# Patient Record
Sex: Female | Born: 1990 | Race: Black or African American | Hispanic: No | Marital: Single | Smoking: Never smoker
Health system: Southern US, Community
[De-identification: ages and names within clinical notes are randomized; demographics above are authoritative.]

## PROBLEM LIST (undated history)

## (undated) ENCOUNTER — Inpatient Hospital Stay (HOSPITAL_COMMUNITY): Payer: Self-pay

## (undated) DIAGNOSIS — R51 Headache: Secondary | ICD-10-CM

## (undated) DIAGNOSIS — R519 Headache, unspecified: Secondary | ICD-10-CM

## (undated) HISTORY — PX: NO PAST SURGERIES: SHX2092

---
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
## 2010-05-17 ENCOUNTER — Emergency Department (HOSPITAL_COMMUNITY): Admission: EM | Admit: 2010-05-17 | Discharge: 2010-05-17 | Payer: Self-pay | Admitting: Family Medicine

## 2010-10-26 ENCOUNTER — Emergency Department (HOSPITAL_COMMUNITY)
Admission: EM | Admit: 2010-10-26 | Discharge: 2010-10-26 | Payer: Self-pay | Source: Home / Self Care | Admitting: Emergency Medicine

## 2010-12-02 ENCOUNTER — Emergency Department (HOSPITAL_COMMUNITY)
Admission: EM | Admit: 2010-12-02 | Discharge: 2010-12-02 | Payer: Self-pay | Source: Home / Self Care | Admitting: Emergency Medicine

## 2010-12-02 LAB — WET PREP, GENITAL
Clue Cells Wet Prep HPF POC: NONE SEEN
Trich, Wet Prep: NONE SEEN

## 2010-12-02 LAB — HCG, SERUM, QUALITATIVE: Preg, Serum: NEGATIVE

## 2010-12-03 LAB — POCT URINALYSIS DIPSTICK
Hgb urine dipstick: NEGATIVE
Ketones, ur: NEGATIVE mg/dL
Nitrite: NEGATIVE
Specific Gravity, Urine: 1.02 (ref 1.005–1.030)
Specific Gravity, Urine: 1.025 (ref 1.005–1.030)
Urine Glucose, Fasting: NEGATIVE mg/dL
Urobilinogen, UA: 0.2 mg/dL (ref 0.0–1.0)

## 2010-12-04 LAB — GC/CHLAMYDIA PROBE AMP, GENITAL
Chlamydia, DNA Probe: NEGATIVE
GC Probe Amp, Genital: NEGATIVE

## 2010-12-04 LAB — URINE CULTURE
Colony Count: >=100000
Culture  Setup Time: 201201300119

## 2010-12-30 ENCOUNTER — Inpatient Hospital Stay (HOSPITAL_COMMUNITY)
Admission: AD | Admit: 2010-12-30 | Discharge: 2010-12-30 | Disposition: A | Discharge status: Home / Self Care | Payer: Medicaid Other | Source: Ambulatory Visit | Attending: Obstetrics and Gynecology | Admitting: Obstetrics and Gynecology

## 2010-12-30 ENCOUNTER — Inpatient Hospital Stay (HOSPITAL_COMMUNITY): Payer: Medicaid Other

## 2010-12-30 DIAGNOSIS — N946 Dysmenorrhea, unspecified: Secondary | ICD-10-CM | POA: Insufficient documentation

## 2010-12-30 LAB — CBC
HCT: 32.7 % — ABNORMAL LOW (ref 36.0–46.0)
MCHC: 31.5 g/dL (ref 30.0–36.0)
Platelets: 318 10*3/uL (ref 150–400)

## 2010-12-30 LAB — WET PREP, GENITAL
Clue Cells Wet Prep HPF POC: NONE SEEN
Trich, Wet Prep: NONE SEEN
Yeast Wet Prep HPF POC: NONE SEEN

## 2010-12-30 LAB — SAMPLE TO BLOOD BANK

## 2010-12-30 LAB — POCT PREGNANCY, URINE: Preg Test, Ur: NEGATIVE

## 2010-12-31 LAB — GC/CHLAMYDIA PROBE AMP, GENITAL: Chlamydia, DNA Probe: NEGATIVE

## 2011-01-14 LAB — POCT URINALYSIS DIPSTICK
Bilirubin Urine: NEGATIVE
Nitrite: NEGATIVE
Specific Gravity, Urine: 1.015 (ref 1.005–1.030)
pH: 8.5 — ABNORMAL HIGH (ref 5.0–8.0)

## 2011-02-08 LAB — POCT URINALYSIS DIP (DEVICE)
Bilirubin Urine: NEGATIVE
Glucose, UA: NEGATIVE mg/dL
Ketones, ur: NEGATIVE mg/dL
Nitrite: NEGATIVE
Protein, ur: NEGATIVE mg/dL
pH: 7.5 (ref 5.0–8.0)

## 2011-02-18 LAB — POCT URINALYSIS DIP (DEVICE)
Nitrite: POSITIVE — AB
Urobilinogen, UA: 0.2 mg/dL (ref 0.0–1.0)
pH: 6.5 (ref 5.0–8.0)

## 2011-08-19 ENCOUNTER — Emergency Department (HOSPITAL_COMMUNITY): Payer: Medicaid Other

## 2011-08-19 ENCOUNTER — Emergency Department (HOSPITAL_COMMUNITY)
Admission: EM | Admit: 2011-08-19 | Discharge: 2011-08-19 | Disposition: A | Discharge status: Home / Self Care | Payer: Medicaid Other | Attending: Emergency Medicine | Admitting: Emergency Medicine

## 2011-08-19 DIAGNOSIS — O200 Threatened abortion: Secondary | ICD-10-CM | POA: Insufficient documentation

## 2011-08-19 DIAGNOSIS — R1031 Right lower quadrant pain: Secondary | ICD-10-CM | POA: Insufficient documentation

## 2011-08-19 LAB — POCT I-STAT, CHEM 8
HCT: 35 % — ABNORMAL LOW (ref 36.0–46.0)
Hemoglobin: 11.9 g/dL — ABNORMAL LOW (ref 12.0–15.0)
Potassium: 3.6 mEq/L (ref 3.5–5.1)
Sodium: 136 mEq/L (ref 135–145)
TCO2: 23 mmol/L (ref 0–100)

## 2011-08-19 LAB — URINALYSIS, ROUTINE W REFLEX MICROSCOPIC
Bilirubin Urine: NEGATIVE
Glucose, UA: NEGATIVE mg/dL
Hgb urine dipstick: NEGATIVE
Specific Gravity, Urine: 1.016 (ref 1.005–1.030)
pH: 6.5 (ref 5.0–8.0)

## 2011-08-19 LAB — WET PREP, GENITAL
Clue Cells Wet Prep HPF POC: NONE SEEN
Trich, Wet Prep: NONE SEEN
Yeast Wet Prep HPF POC: NONE SEEN

## 2011-08-19 LAB — CBC
HCT: 30.9 % — ABNORMAL LOW (ref 36.0–46.0)
Hemoglobin: 10.1 g/dL — ABNORMAL LOW (ref 12.0–15.0)
MCHC: 32.7 g/dL (ref 30.0–36.0)
RDW: 14.8 % (ref 11.5–15.5)
WBC: 10 10*3/uL (ref 4.0–10.5)

## 2011-08-19 LAB — DIFFERENTIAL
Basophils Absolute: 0 10*3/uL (ref 0.0–0.1)
Basophils Relative: 0 % (ref 0–1)
Lymphocytes Relative: 17 % (ref 12–46)
Neutro Abs: 7.4 10*3/uL (ref 1.7–7.7)

## 2011-08-19 LAB — POCT PREGNANCY, URINE: Preg Test, Ur: POSITIVE

## 2011-08-19 LAB — ABO/RH: ABO/RH(D): O POS

## 2011-08-19 LAB — HCG, QUANTITATIVE, PREGNANCY: hCG, Beta Chain, Quant, S: 158739 m[IU]/mL — ABNORMAL HIGH (ref <5)

## 2011-08-20 LAB — GC/CHLAMYDIA PROBE AMP, GENITAL: GC Probe Amp, Genital: NEGATIVE

## 2011-08-21 LAB — OB RESULTS CONSOLE HIV ANTIBODY (ROUTINE TESTING): HIV: NONREACTIVE

## 2011-09-14 ENCOUNTER — Inpatient Hospital Stay (HOSPITAL_COMMUNITY)
Admission: AD | Admit: 2011-09-14 | Discharge: 2011-09-14 | Disposition: A | Discharge status: Home / Self Care | Payer: Medicaid Other | Source: Ambulatory Visit | Attending: Obstetrics and Gynecology | Admitting: Obstetrics and Gynecology

## 2011-09-14 ENCOUNTER — Encounter (HOSPITAL_COMMUNITY): Payer: Self-pay

## 2011-09-14 DIAGNOSIS — O285 Abnormal chromosomal and genetic finding on antenatal screening of mother: Secondary | ICD-10-CM | POA: Diagnosis present

## 2011-09-14 DIAGNOSIS — D649 Anemia, unspecified: Secondary | ICD-10-CM

## 2011-09-14 DIAGNOSIS — O209 Hemorrhage in early pregnancy, unspecified: Secondary | ICD-10-CM | POA: Diagnosis present

## 2011-09-14 NOTE — ED Provider Notes (Signed)
History     Chief Complaint  Patient presents with  . Vaginal Bleeding   HPI Comments: Pt is a G1P0 at 14wks with CC of vaginal bleeding that started last night and has continued off and on today. Pt states she's had to use a pad, denies passing any clots, states she's changed her pad a couple times. Denies any pain, states she's maybe felt mild cramping once or twice. Also had bleeding earlier on in pregnancy.  Pt was seen in the office on Friday. Per pt her US showed that "I might have a miscarriage, the baby might be premature or the baby might be autistic"  Pt denies any abnormal discharge, no severe N/V.       Past Medical History  Diagnosis Date  . Asthma     No past surgical history on file.  No family history on file.  History  Substance Use Topics  . Smoking status: Former Games developer  . Smokeless tobacco: Not on file  . Alcohol Use: No    Allergies: No Known Allergies  Prescriptions prior to admission  Medication Sig Dispense Refill  . acetaminophen (TYLENOL) 325 MG tablet Take 650 mg by mouth every 6 (six) hours as needed. For headaches       . prenatal vitamin w/FE, FA (PRENATAL 1 + 1) 27-1 MG TABS Take 1 tablet by mouth daily.          Review of Systems  Genitourinary:       Vaginal bleeding   All other systems reviewed and are negative.   Physical Exam   Blood pressure 106/64, pulse 117, temperature 99 F (37.2 C), temperature source Oral, resp. rate 18, height 5\' 4"  (1.626 m), weight 55.339 kg (122 lb).  Physical Exam  Constitutional: She is oriented to person, place, and time. She appears well-developed and well-nourished.  Cardiovascular: Normal rate.   Respiratory: Effort normal.  GI: Soft. There is no tenderness.  Genitourinary:       Deferred, peri-pad with 2 sm spots of brown/red bleeding  Musculoskeletal: Normal range of motion.  Neurological: She is alert and oriented to person, place, and time.  Skin: Skin is warm and dry.  Psychiatric:  She has a normal mood and affect. Her behavior is normal.   +FHT's    MAU Course  Procedures    Assessment and Plan  IUP at 14wks  1st trimester bleeding Abnormal 1st trimester screen 1:90 DS risk  D/W Dr. Pennie Rushing May dc pt home, she is to f/u with MFM for anatomy US Bleeding precautions given Clarified with pt  increased DS risk    Kato Wieczorek M 09/14/2011, 10:40 AM

## 2011-09-14 NOTE — Progress Notes (Signed)
Onset of vaginal bleeding since yesterday after seeing OB/GYN was not bleeding then started off as brown then had a lot of blood in toilet no clots, still bleeding, mild cramping has had early episode of bleeding early in pregnancy.

## 2011-09-16 ENCOUNTER — Other Ambulatory Visit (HOSPITAL_COMMUNITY): Payer: Self-pay | Admitting: Obstetrics and Gynecology

## 2011-09-16 DIAGNOSIS — O289 Unspecified abnormal findings on antenatal screening of mother: Secondary | ICD-10-CM

## 2011-10-09 ENCOUNTER — Ambulatory Visit (HOSPITAL_COMMUNITY): Payer: Medicaid Other | Attending: Obstetrics and Gynecology

## 2011-10-15 ENCOUNTER — Ambulatory Visit (HOSPITAL_COMMUNITY)
Admission: RE | Admit: 2011-10-15 | Discharge: 2011-10-15 | Disposition: A | Discharge status: Home / Self Care | Payer: Medicaid Other | Source: Ambulatory Visit | Attending: Obstetrics and Gynecology | Admitting: Obstetrics and Gynecology

## 2011-10-15 ENCOUNTER — Encounter (HOSPITAL_COMMUNITY): Payer: Self-pay

## 2011-10-15 DIAGNOSIS — A6000 Herpesviral infection of urogenital system, unspecified: Secondary | ICD-10-CM | POA: Insufficient documentation

## 2011-10-15 DIAGNOSIS — Z1389 Encounter for screening for other disorder: Secondary | ICD-10-CM | POA: Insufficient documentation

## 2011-10-15 DIAGNOSIS — O289 Unspecified abnormal findings on antenatal screening of mother: Secondary | ICD-10-CM

## 2011-10-15 DIAGNOSIS — Z363 Encounter for antenatal screening for malformations: Secondary | ICD-10-CM | POA: Insufficient documentation

## 2011-10-15 DIAGNOSIS — O98519 Other viral diseases complicating pregnancy, unspecified trimester: Secondary | ICD-10-CM | POA: Insufficient documentation

## 2011-10-15 DIAGNOSIS — O358XX0 Maternal care for other (suspected) fetal abnormality and damage, not applicable or unspecified: Secondary | ICD-10-CM | POA: Insufficient documentation

## 2011-10-15 NOTE — Progress Notes (Signed)
Genetic Counseling  High-Risk Gestation Note  Appointment Date:  10/15/2011 Referred By: Hal Morales, MD Date of Birth:  [birth year] Partner:  Lauren Booker    Pregnancy History: G1P0000 Estimated Date of Delivery: 03/14/12 Estimated Gestational Age: [redacted]w[redacted]d Attending: Particia Nearing, MD  Ms. Lauren Booker and her partner, Mr. Lauren Booker, were seen for genetic counseling because of an increased risk for fetal Down syndrome based on First trimester screen performed through [facility].  They were counseled regarding the First trimester screen result and the associated 1 in 90 risk for fetal Down syndrome.  We reviewed chromosomes, nondisjunction, and the common features and variable prognosis of Down syndrome.  In addition, we reviewed the screen adjusted reduction in risks for trisomies 18/13 (less than 1 in 10,000).  We also discussed other explanations for a screen positive result including: differences in maternal metabolism, and normal variation.  We reviewed other available screening and diagnostic options including detailed ultrasound and amniocentesis. They were counseled that 50-80% of fetuses with Down syndrome, when well visualized, have detectable anomalies or soft markers by ultrasound.  We discussed the risks, limitations, and benefits of each.  We discussed another type of screening test, noninvasive prenatal testing (NIPT), which utilizes cell free fetal DNA which is found in the maternal circulation. This test is not diagnostic for chromosome conditions, but can provide information regarding the presence or absence of extra fetal DNA for chromosomes 13, 18 and 21. The reported detection rate for Trisomy 21, and Trisomy 18 is greater than 99%, and is approximately 91% for Trisomy 13. The false positive rate is thought to be less than 1% for any of these conditions.   After thoughtful consideration of these options, Ms. Lauren Booker elected to have ultrasound, but declined  amniocentesis. Ultrasound performed today visualized an echogenic intracardiac focus (EIF). The ultrasound report will be sent under a separate cover.  An isolated echogenic focus is generally believed to be a normal variation without any concerns for the pregnancy.  Isolated echogenic cardiac foci are not associated with congenital heart defects in the baby or compromised cardiac function after birth.  However, an echogenic cardiac focus is associated with a slightly increased chance for Down syndrome in the pregnancy. Thus, the presence of an EIF would increase the risk for Down syndrome above the patient's First trimester screen result of 1 in 90 to 1 in  45. After consideration of all the options, and a clear understanding of the newness and limitations of the cell free fetal DNA testing, they elected to proceed with cell free fetal DNA testing and declined amniocentesis. Those results will be available in 8-10 days and will be forwarded to the patient's OB office when we receive them. They understand that ultrasound cannot rule out all birth defects or genetic syndromes.  The patient stated that she is not interested in amniocentesis at this time or in the future given the associated risk of complications.   Ms. Lauren Booker was provided with written information regarding sickle cell anemia (SCA) including the carrier frequency and incidence in the African-American population, the availability of carrier testing and prenatal diagnosis if indicated.  In addition, we discussed that hemoglobinopathies are routinely screened for as part of the [place] newborn screening panel.  She declined hemoglobin electrophoresis today and additional discussion of sickle cell testing.   Both family histories were reviewed and found to be noncontributory for birth defects, mental retardation, recurrent pregnancy loss, or known genetic conditions. Without further information regarding the provided  family history, an accurate genetic risk  cannot be calculated. Further genetic counseling is warranted if more information is obtained.  Ms. Lauren Booker  denied exposure to environmental toxins or chemical agents. She denied the use of alcohol, tobacco or street drugs. She denied significant viral illnesses during the course of her pregnancy. Her medical and surgical histories were contributory for migraines.   I counseled this couple for approximately 30 minutes regarding the above risks and available options.     Quinn Plowman, MS,  Certified Genetic Counselor 10/15/2011

## 2011-10-16 ENCOUNTER — Other Ambulatory Visit: Payer: Self-pay

## 2011-10-25 ENCOUNTER — Telehealth (HOSPITAL_COMMUNITY): Payer: Self-pay | Admitting: MS"

## 2011-10-25 NOTE — Telephone Encounter (Signed)
Called Lauren Booker to discuss results of [facility] (cell free fetal DNA testing), a type of noninvasive prenatal testing (NIPT).  These results are within normal range indicating less than 1 in 10,000 risk for each trisomy 21, trisomy 71, and trisomy 13.   We reviewed the limitations of this testing. This test  utilizes cell free fetal DNA found in the maternal circulation. This test is not diagnostic for chromosome conditions, but can provide information regarding the presence or absence of extra fetal DNA for chromosomes 13, 18 and 21. The reported detection rate is greater than 99% for Trisomy 21, greater than 97% for Trisomy 18, and is approximately 80% (8 out of 10) for Trisomy 13. The false positive rate is thought to be less than 1% for any of these conditions.

## 2011-11-04 ENCOUNTER — Other Ambulatory Visit: Payer: Self-pay

## 2011-11-05 NOTE — L&D Delivery Note (Signed)
Delivery Note At 2:45 PM a viable female, "Lauren Booker", was delivered via Vaginal, Spontaneous Delivery (Presentation: Left Occiput Anterior).  APGAR: 9, 9; weight 6 lb 14.6 oz (3135 g).   Placenta status: Intact, Spontaneous.  Cord: 3 vessels with the following complications: Short.  Cord pH: NA  Anesthesia: Epidural  Episiotomy: None Lacerations: 1st degree;Periurethral;Vaginal Suture Repair: 3.0 Est. Blood Loss (mL): 250  Mom to postpartum.  Baby to skin to skin.  Nigel Bridgeman 03/07/2012, 4:37 PM

## 2012-01-10 ENCOUNTER — Other Ambulatory Visit: Payer: Self-pay

## 2012-01-10 ENCOUNTER — Other Ambulatory Visit: Payer: Medicaid Other

## 2012-01-10 ENCOUNTER — Encounter (INDEPENDENT_AMBULATORY_CARE_PROVIDER_SITE_OTHER): Payer: Medicaid Other | Admitting: Obstetrics and Gynecology

## 2012-01-10 DIAGNOSIS — Z3400 Encounter for supervision of normal first pregnancy, unspecified trimester: Secondary | ICD-10-CM

## 2012-01-10 DIAGNOSIS — O358XX0 Maternal care for other (suspected) fetal abnormality and damage, not applicable or unspecified: Secondary | ICD-10-CM

## 2012-01-24 ENCOUNTER — Encounter (INDEPENDENT_AMBULATORY_CARE_PROVIDER_SITE_OTHER): Payer: Medicaid Other | Admitting: Obstetrics and Gynecology

## 2012-01-24 DIAGNOSIS — Z331 Pregnant state, incidental: Secondary | ICD-10-CM

## 2012-02-10 ENCOUNTER — Other Ambulatory Visit: Payer: Self-pay | Admitting: Obstetrics and Gynecology

## 2012-02-10 ENCOUNTER — Encounter (INDEPENDENT_AMBULATORY_CARE_PROVIDER_SITE_OTHER): Payer: Medicaid Other | Admitting: Obstetrics and Gynecology

## 2012-02-10 DIAGNOSIS — Z331 Pregnant state, incidental: Secondary | ICD-10-CM

## 2012-02-10 DIAGNOSIS — O368190 Decreased fetal movements, unspecified trimester, not applicable or unspecified: Secondary | ICD-10-CM

## 2012-02-11 LAB — GC/CHLAMYDIA PROBE AMP, GENITAL
Chlamydia, DNA Probe: NEGATIVE
GC Probe Amp, Genital: NEGATIVE

## 2012-02-17 DIAGNOSIS — B009 Herpesviral infection, unspecified: Secondary | ICD-10-CM

## 2012-02-19 ENCOUNTER — Ambulatory Visit (INDEPENDENT_AMBULATORY_CARE_PROVIDER_SITE_OTHER): Payer: Medicaid Other | Admitting: Obstetrics and Gynecology

## 2012-02-19 ENCOUNTER — Encounter: Payer: Self-pay | Admitting: Obstetrics and Gynecology

## 2012-02-19 VITALS — BP 104/66 | Wt 148.0 lb

## 2012-02-19 DIAGNOSIS — O285 Abnormal chromosomal and genetic finding on antenatal screening of mother: Secondary | ICD-10-CM

## 2012-02-19 DIAGNOSIS — O289 Unspecified abnormal findings on antenatal screening of mother: Secondary | ICD-10-CM

## 2012-02-19 DIAGNOSIS — J45909 Unspecified asthma, uncomplicated: Secondary | ICD-10-CM | POA: Insufficient documentation

## 2012-02-19 DIAGNOSIS — Z331 Pregnant state, incidental: Secondary | ICD-10-CM

## 2012-02-19 DIAGNOSIS — Q897 Multiple congenital malformations, not elsewhere classified: Secondary | ICD-10-CM

## 2012-02-19 NOTE — Progress Notes (Signed)
GBS, GC, Chlamydia negative No LOF No bleeding

## 2012-02-19 NOTE — Progress Notes (Signed)
Pt. Stated of white d/c and sticky no itching no blood. Been going on for  About a week.

## 2012-02-25 ENCOUNTER — Inpatient Hospital Stay (HOSPITAL_COMMUNITY)
Admission: AD | Admit: 2012-02-25 | Discharge: 2012-02-26 | Disposition: A | Discharge status: Home / Self Care | Payer: Medicaid Other | Source: Ambulatory Visit | Attending: Obstetrics and Gynecology | Admitting: Obstetrics and Gynecology

## 2012-02-25 DIAGNOSIS — O479 False labor, unspecified: Secondary | ICD-10-CM | POA: Insufficient documentation

## 2012-02-25 DIAGNOSIS — O471 False labor at or after 37 completed weeks of gestation: Secondary | ICD-10-CM

## 2012-02-26 ENCOUNTER — Ambulatory Visit (INDEPENDENT_AMBULATORY_CARE_PROVIDER_SITE_OTHER): Payer: Medicaid Other

## 2012-02-26 ENCOUNTER — Other Ambulatory Visit: Payer: Self-pay | Admitting: Obstetrics and Gynecology

## 2012-02-26 ENCOUNTER — Encounter: Payer: Self-pay | Admitting: Obstetrics and Gynecology

## 2012-02-26 ENCOUNTER — Ambulatory Visit (INDEPENDENT_AMBULATORY_CARE_PROVIDER_SITE_OTHER): Payer: Medicaid Other | Admitting: Obstetrics and Gynecology

## 2012-02-26 VITALS — BP 92/64 | Wt 151.0 lb

## 2012-02-26 DIAGNOSIS — O479 False labor, unspecified: Secondary | ICD-10-CM

## 2012-02-26 DIAGNOSIS — O285 Abnormal chromosomal and genetic finding on antenatal screening of mother: Secondary | ICD-10-CM

## 2012-02-26 DIAGNOSIS — Z3400 Encounter for supervision of normal first pregnancy, unspecified trimester: Secondary | ICD-10-CM

## 2012-02-26 DIAGNOSIS — O289 Unspecified abnormal findings on antenatal screening of mother: Secondary | ICD-10-CM

## 2012-02-26 LAB — US OB FOLLOW UP

## 2012-02-26 MED ORDER — ZOLPIDEM TARTRATE 10 MG PO TABS: 10.0000 mg | ORAL_TABLET | Freq: Every evening | ORAL | Status: DC | PRN

## 2012-02-26 MED ORDER — ZOLPIDEM TARTRATE 10 MG PO TABS
10.0000 mg | ORAL_TABLET | Freq: Once | ORAL | Status: AC
  Administered 2012-02-26: 10 mg via ORAL
  Filled 2012-02-26: qty 1

## 2012-02-26 NOTE — Progress Notes (Signed)
Ultrasound shows:  SIUP  S=D     [place] EDD: c/w dates            AFI: 16.3cm           Cervical length: not done            Placenta localization: anterior           Fetal presentation: cephalic    EIF resolved        EFW 6lbs 12oz 58% No complaints  U/S reviewed Doing Well FKCs and Labor Precautions RTO 1wk

## 2012-02-26 NOTE — MAU Provider Note (Signed)
History   Lauren Booker is a 21y.o. [place] female at 37.4 weeks who presents unannounced for labor check with CC of lower abdominal and pelvic pain.  Hasn't counted ctxs.  Denies LOF or VB.  Reports active fetus.  No recent illness or fever.  Recent episode of N/V in last 1-2 days, but not recurrent.  Reports adequate hydration.  Presents w/ her same-sex partner.  Reports appt at [facility] this morning at 1000 for ROB and growth u/s. Pregnancy r/f: 1.  Anemia 2.  H/o asthma 3.  Hernia history 4.  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  Same-sex partner, but FOB is peripherally involved 6.  GBS positive 7.  Increased DSR 1:90  CSN: [id number]  Arrival date and time: 02/25/12 2327   None     Chief Complaint  Patient presents with  . Labor Eval   HPI  OB History    Grav Para Term Preterm Abortions TAB SAB Ect Mult Living   1 0 0 0 0 0 0 0 0 0       Past Medical History  Diagnosis Date  . Asthma     No past surgical history on file.  Family History  Problem Relation Age of Onset  . Hypertension Maternal Grandmother   . Diabetes Maternal Grandmother   . Hypertension Paternal Grandmother   . Diabetes Paternal Grandmother     History  Substance Use Topics  . Smoking status: Never Smoker   . Smokeless tobacco: Never Used  . Alcohol Use: No    Allergies: No Known Allergies  Prescriptions prior to admission  Medication Sig Dispense Refill  . acetaminophen (TYLENOL) 325 MG tablet Take 650 mg by mouth as needed. For headaches      . diphenhydramine-acetaminophen (TYLENOL PM) 25-500 MG TABS Take 1 tablet by mouth as needed. Patient states that she takes Tylenol PM for pain.      . prenatal vitamin w/FE, FA (PRENATAL 1 + 1) 27-1 MG TABS Take 1 tablet by mouth daily.        . valACYclovir (VALTREX) 500 MG tablet Take 500 mg by mouth daily.        ROS--see history above Physical Exam   Blood pressure 124/73, pulse 94, temperature 98.1 F (36.7 C), temperature source Oral, resp. rate 20,  height 5\' 2"  (1.575 m), weight 67.132 kg (148 lb), last menstrual period 05/24/2011, SpO2 100.00%. EFM:  140, reactive, moderate variability, no decels TOCO:  UC's q 6-9 min, mild Physical Exam  Constitutional: She is oriented to person, place, and time. She appears well-developed and well-nourished. No distress.       Pt's nervous about bimanual exam and asked "You're not going to hurt me are you?"  She also threw the sheet over her face during exam.  Pt's partner was laying on her at bedside and sucking her thumb on my arrival to the room.  Cardiovascular: Normal rate.   Respiratory: Effort normal.  GI: Soft.       gravid  Genitourinary:       Cx:  3/75/-2 to -1; posterior; intact, vtx.  Neurological: She is alert and oriented to person, place, and time.  Skin: Skin is warm and dry.  Psychiatric:       See note under constitutional    MAU Course  Procedures 1.  NST  Assessment and Plan  1.  IUP at 37.4 2.  No cervical change since last week's exam at office 3.  Reactive NST 4.  False labor 5.  MOPS/musculoskeletal pains  1.  Offered pt therapeutic rest options, and desired Ambien; given 1 tab po x1 at time of d/c, and pt left without Rx. 2.  Labor precautions and FKC rev'd 3.  Keep appt this morning at [facility] at 1000, or f/u prn worsening s/s or concerns  Lumen Brinlee H 02/26/2012, 12:23 AM

## 2012-02-26 NOTE — MAU Note (Signed)
Eustace Pen, CNM at bedside.  assessment done and poc discussed with pt.

## 2012-02-26 NOTE — Discharge Instructions (Signed)
Normal Labor and Delivery Your caregiver must first be sure you are in labor. Signs of labor include:  You may pass what is called "the mucus plug" before labor begins. This is a small amount of blood stained mucus.   Regular uterine contractions.   The time between contractions get closer together.   The discomfort and pain gradually gets more intense.   Pains are mostly located in the back.   Pains get worse when walking.   The cervix (the opening of the uterus becomes thinner (begins to efface) and opens up (dilates).  Once you are in labor and admitted into the hospital or care center, your caregiver will do the following:  A complete physical examination.   Check your vital signs (blood pressure, pulse, temperature and the fetal heart rate).   Do a vaginal examination (using a sterile glove and lubricant) to determine:   The position (presentation) of the baby (head [vertex] or buttock first).   The level (station) of the baby's head in the birth canal.   The effacement and dilatation of the cervix.   You may have your pubic hair shaved and be given an enema depending on your caregiver and the circumstance.   An electronic monitor is usually placed on your abdomen. The monitor follows the length and intensity of the contractions, as well as the baby's heart rate.   Usually, your caregiver will insert an IV in your arm with a bottle of sugar water. This is done as a precaution so that medications can be given to you quickly during labor or delivery.  NORMAL LABOR AND DELIVERY IS DIVIDED UP INTO 3 STAGES: First Stage This is when regular contractions begin and the cervix begins to efface and dilate. This stage can last from 3 to 15 hours. The end of the first stage is when the cervix is 100% effaced and 10 centimeters dilated. Pain medications may be given by   Injection (morphine, demerol, etc.)   Regional anesthesia (spinal, caudal or epidural, anesthetics given in  different locations of the spine). Paracervical pain medication may be given, which is an injection of and anesthetic on each side of the cervix.  A pregnant woman may request to have "Natural Childbirth" which is not to have any medications or anesthesia during her labor and delivery. Second Stage This is when the baby comes down through the birth canal (vagina) and is born. This can take 1 to 4 hours. As the baby's head comes down through the birth canal, you may feel like you are going to have a bowel movement. You will get the urge to bear down and push until the baby is delivered. As the baby's head is being delivered, the caregiver will decide if an episiotomy (a cut in the perineum and vagina area) is needed to prevent tearing of the tissue in this area. The episiotomy is sewn up after the delivery of the baby and placenta. Sometimes a mask with nitrous oxide is given for the mother to breath during the delivery of the baby to help if there is too much pain. The end of Stage 2 is when the baby is fully delivered. Then when the umbilical cord stops pulsating it is clamped and cut. Third Stage The third stage begins after the baby is completely delivered and ends after the placenta (afterbirth) is delivered. This usually takes 5 to 30 minutes. After the placenta is delivered, a medication is given either by intravenous or injection to help contract   the uterus and prevent bleeding. The third stage is not painful and pain medication is usually not necessary. If an episiotomy was done, it is repaired at this time. After the delivery, the mother is watched and monitored closely for 1 to 2 hours to make sure there is no postpartum bleeding (hemorrhage). If there is a lot of bleeding, medication is given to contract the uterus and stop the bleeding. Document Released: 07/30/2008 Document Revised: 10/10/2011 Document Reviewed: 07/30/2008 Exit[employer] Patient Information 2012 [employer]. 

## 2012-02-27 ENCOUNTER — Telehealth: Payer: Self-pay | Admitting: Obstetrics and Gynecology

## 2012-02-27 NOTE — Telephone Encounter (Signed)
Reviewed common discomforts of pg, comfort measures, report if sx change or worsen, reviewed s/s uc, srom, vag bleeding, daily fetal kick counts to report, declined visit to MAU. Keep f/o office appt. Lavera Guise, CNM

## 2012-03-04 ENCOUNTER — Ambulatory Visit (INDEPENDENT_AMBULATORY_CARE_PROVIDER_SITE_OTHER): Payer: Medicaid Other | Admitting: Obstetrics and Gynecology

## 2012-03-04 VITALS — BP 104/70 | Wt 152.0 lb

## 2012-03-04 DIAGNOSIS — B955 Unspecified streptococcus as the cause of diseases classified elsewhere: Secondary | ICD-10-CM

## 2012-03-04 DIAGNOSIS — B951 Streptococcus, group B, as the cause of diseases classified elsewhere: Secondary | ICD-10-CM

## 2012-03-04 DIAGNOSIS — O2390 Unspecified genitourinary tract infection in pregnancy, unspecified trimester: Secondary | ICD-10-CM

## 2012-03-04 DIAGNOSIS — N390 Urinary tract infection, site not specified: Secondary | ICD-10-CM

## 2012-03-04 DIAGNOSIS — Z331 Pregnant state, incidental: Secondary | ICD-10-CM

## 2012-03-04 NOTE — Progress Notes (Addendum)
Doing well.  Return to office in 1 week.  No signs or symptoms of herpes virus. Normal strength in her hands.  Possible carpal tunnel syndrome.   Dr. Stefano Gaul

## 2012-03-04 NOTE — Progress Notes (Signed)
Pt c/o wrist pain( left)

## 2012-03-07 ENCOUNTER — Encounter (HOSPITAL_COMMUNITY): Payer: Self-pay

## 2012-03-07 ENCOUNTER — Inpatient Hospital Stay (HOSPITAL_COMMUNITY): Payer: Medicaid Other | Admitting: Anesthesiology

## 2012-03-07 ENCOUNTER — Inpatient Hospital Stay (HOSPITAL_COMMUNITY)
Admission: AD | Admit: 2012-03-07 | Discharge: 2012-03-09 | DRG: 775 | Disposition: A | Discharge status: Home / Self Care | Payer: Medicaid Other | Source: Ambulatory Visit | Attending: Obstetrics and Gynecology | Admitting: Obstetrics and Gynecology

## 2012-03-07 ENCOUNTER — Encounter (HOSPITAL_COMMUNITY): Payer: Self-pay | Admitting: Anesthesiology

## 2012-03-07 ENCOUNTER — Encounter (HOSPITAL_COMMUNITY): Payer: Self-pay | Admitting: *Deleted

## 2012-03-07 DIAGNOSIS — O285 Abnormal chromosomal and genetic finding on antenatal screening of mother: Secondary | ICD-10-CM

## 2012-03-07 DIAGNOSIS — Z22330 Carrier of Group B streptococcus: Secondary | ICD-10-CM

## 2012-03-07 DIAGNOSIS — O9081 Anemia of the puerperium: Secondary | ICD-10-CM | POA: Diagnosis not present

## 2012-03-07 DIAGNOSIS — O209 Hemorrhage in early pregnancy, unspecified: Secondary | ICD-10-CM

## 2012-03-07 DIAGNOSIS — IMO0001 Reserved for inherently not codable concepts without codable children: Secondary | ICD-10-CM

## 2012-03-07 DIAGNOSIS — O99892 Other specified diseases and conditions complicating childbirth: Secondary | ICD-10-CM | POA: Diagnosis present

## 2012-03-07 LAB — CBC
HCT: 32.4 % — ABNORMAL LOW (ref 36.0–46.0)
MCH: 28.1 pg (ref 26.0–34.0)
MCHC: 31.5 g/dL (ref 30.0–36.0)
MCV: 89.3 fL (ref 78.0–100.0)
Platelets: 225 10*3/uL (ref 150–400)
RDW: 14.1 % (ref 11.5–15.5)

## 2012-03-07 MED ORDER — LACTATED RINGERS IV SOLN
500.0000 mL | INTRAVENOUS | Status: DC | PRN
  Administered 2012-03-07: 250 mL via INTRAVENOUS

## 2012-03-07 MED ORDER — PHENYLEPHRINE 40 MCG/ML (10ML) SYRINGE FOR IV PUSH (FOR BLOOD PRESSURE SUPPORT)
80.0000 ug | PREFILLED_SYRINGE | INTRAVENOUS | Status: DC | PRN
  Filled 2012-03-07: qty 5

## 2012-03-07 MED ORDER — IBUPROFEN 600 MG PO TABS
600.0000 mg | ORAL_TABLET | Freq: Four times a day (QID) | ORAL | Status: DC
  Administered 2012-03-08 – 2012-03-09 (×6): 600 mg via ORAL
  Filled 2012-03-07 (×7): qty 1

## 2012-03-07 MED ORDER — DIPHENHYDRAMINE HCL 25 MG PO CAPS: 25.0000 mg | ORAL_CAPSULE | Freq: Four times a day (QID) | ORAL | Status: DC | PRN

## 2012-03-07 MED ORDER — BENZOCAINE-MENTHOL 20-0.5 % EX AERO
1.0000 "application " | INHALATION_SPRAY | CUTANEOUS | Status: DC | PRN
  Filled 2012-03-07: qty 56

## 2012-03-07 MED ORDER — EPHEDRINE 5 MG/ML INJ
10.0000 mg | INTRAVENOUS | Status: DC | PRN
  Filled 2012-03-07: qty 4

## 2012-03-07 MED ORDER — BUTORPHANOL TARTRATE 2 MG/ML IJ SOLN: 1.0000 mg | INTRAMUSCULAR | Status: DC | PRN

## 2012-03-07 MED ORDER — ZOLPIDEM TARTRATE 5 MG PO TABS: 5.0000 mg | ORAL_TABLET | Freq: Every evening | ORAL | Status: DC | PRN

## 2012-03-07 MED ORDER — ONDANSETRON HCL 4 MG/2ML IJ SOLN: 4.0000 mg | INTRAMUSCULAR | Status: DC | PRN

## 2012-03-07 MED ORDER — DIBUCAINE 1 % RE OINT: 1.0000 "application " | TOPICAL_OINTMENT | RECTAL | Status: DC | PRN

## 2012-03-07 MED ORDER — PHENYLEPHRINE 40 MCG/ML (10ML) SYRINGE FOR IV PUSH (FOR BLOOD PRESSURE SUPPORT): 80.0000 ug | PREFILLED_SYRINGE | INTRAVENOUS | Status: DC | PRN

## 2012-03-07 MED ORDER — LACTATED RINGERS IV SOLN: 500.0000 mL | Freq: Once | INTRAVENOUS | Status: DC

## 2012-03-07 MED ORDER — PRENATAL MULTIVITAMIN CH
1.0000 | ORAL_TABLET | Freq: Every day | ORAL | Status: DC
  Administered 2012-03-07 – 2012-03-09 (×3): 1 via ORAL
  Filled 2012-03-07 (×3): qty 1

## 2012-03-07 MED ORDER — OXYCODONE-ACETAMINOPHEN 5-325 MG PO TABS
1.0000 | ORAL_TABLET | ORAL | Status: DC | PRN
  Filled 2012-03-07: qty 1

## 2012-03-07 MED ORDER — SENNOSIDES-DOCUSATE SODIUM 8.6-50 MG PO TABS
2.0000 | ORAL_TABLET | Freq: Every day | ORAL | Status: DC
  Administered 2012-03-07: 2 via ORAL

## 2012-03-07 MED ORDER — LIDOCAINE HCL (PF) 1 % IJ SOLN
INTRAMUSCULAR | Status: DC | PRN
  Administered 2012-03-07 (×2): 8 mL

## 2012-03-07 MED ORDER — IBUPROFEN 600 MG PO TABS
600.0000 mg | ORAL_TABLET | Freq: Four times a day (QID) | ORAL | Status: DC | PRN
  Administered 2012-03-07: 600 mg via ORAL
  Filled 2012-03-07: qty 1

## 2012-03-07 MED ORDER — CITRIC ACID-SODIUM CITRATE 334-500 MG/5ML PO SOLN: 30.0000 mL | ORAL | Status: DC | PRN

## 2012-03-07 MED ORDER — DIPHENHYDRAMINE HCL 50 MG/ML IJ SOLN
12.5000 mg | INTRAMUSCULAR | Status: DC | PRN
  Administered 2012-03-07: 12.5 mg via INTRAVENOUS
  Filled 2012-03-07: qty 1

## 2012-03-07 MED ORDER — WITCH HAZEL-GLYCERIN EX PADS: 1.0000 "application " | MEDICATED_PAD | CUTANEOUS | Status: DC | PRN

## 2012-03-07 MED ORDER — TETANUS-DIPHTH-ACELL PERTUSSIS 5-2.5-18.5 LF-MCG/0.5 IM SUSP
0.5000 mL | Freq: Once | INTRAMUSCULAR | Status: AC
  Administered 2012-03-08: 0.5 mL via INTRAMUSCULAR
  Filled 2012-03-07: qty 0.5

## 2012-03-07 MED ORDER — LACTATED RINGERS IV SOLN: INTRAVENOUS | Status: DC

## 2012-03-07 MED ORDER — OXYTOCIN BOLUS FROM INFUSION
500.0000 mL | Freq: Once | INTRAVENOUS | Status: DC
  Filled 2012-03-07: qty 500
  Filled 2012-03-07: qty 1000

## 2012-03-07 MED ORDER — FENTANYL 2.5 MCG/ML BUPIVACAINE 1/10 % EPIDURAL INFUSION (WH - ANES)
14.0000 mL/h | INTRAMUSCULAR | Status: DC
  Administered 2012-03-07 (×2): 14 mL/h via EPIDURAL
  Filled 2012-03-07 (×3): qty 60

## 2012-03-07 MED ORDER — SIMETHICONE 80 MG PO CHEW: 80.0000 mg | CHEWABLE_TABLET | ORAL | Status: DC | PRN

## 2012-03-07 MED ORDER — OXYTOCIN 20 UNITS IN LACTATED RINGERS INFUSION - SIMPLE
125.0000 mL/h | Freq: Once | INTRAVENOUS | Status: AC
  Administered 2012-03-07: 999 mL/h via INTRAVENOUS

## 2012-03-07 MED ORDER — LIDOCAINE HCL (PF) 1 % IJ SOLN
30.0000 mL | INTRAMUSCULAR | Status: DC | PRN
  Administered 2012-03-07: 30 mL via SUBCUTANEOUS
  Filled 2012-03-07: qty 30

## 2012-03-07 MED ORDER — OXYCODONE-ACETAMINOPHEN 5-325 MG PO TABS
1.0000 | ORAL_TABLET | ORAL | Status: DC | PRN
  Administered 2012-03-08: 1 via ORAL
  Filled 2012-03-07: qty 1

## 2012-03-07 MED ORDER — ONDANSETRON HCL 4 MG/2ML IJ SOLN: 4.0000 mg | Freq: Four times a day (QID) | INTRAMUSCULAR | Status: DC | PRN

## 2012-03-07 MED ORDER — EPHEDRINE 5 MG/ML INJ: 10.0000 mg | INTRAVENOUS | Status: DC | PRN

## 2012-03-07 MED ORDER — FLEET ENEMA 7-19 GM/118ML RE ENEM: 1.0000 | ENEMA | RECTAL | Status: DC | PRN

## 2012-03-07 MED ORDER — FENTANYL 2.5 MCG/ML BUPIVACAINE 1/10 % EPIDURAL INFUSION (WH - ANES)
INTRAMUSCULAR | Status: DC | PRN
  Administered 2012-03-07: 14 mL/h via EPIDURAL

## 2012-03-07 MED ORDER — LANOLIN HYDROUS EX OINT: TOPICAL_OINTMENT | CUTANEOUS | Status: DC | PRN

## 2012-03-07 MED ORDER — ACETAMINOPHEN 325 MG PO TABS: 650.0000 mg | ORAL_TABLET | ORAL | Status: DC | PRN

## 2012-03-07 MED ORDER — CEFAZOLIN SODIUM-DEXTROSE 2-3 GM-% IV SOLR
2.0000 g | Freq: Four times a day (QID) | INTRAVENOUS | Status: DC
  Administered 2012-03-07: 2 g via INTRAVENOUS
  Filled 2012-03-07 (×4): qty 50

## 2012-03-07 MED ORDER — ONDANSETRON HCL 4 MG PO TABS: 4.0000 mg | ORAL_TABLET | ORAL | Status: DC | PRN

## 2012-03-07 NOTE — Anesthesia Procedure Notes (Signed)
Epidural Patient location during procedure: OB Start time: 03/07/2012 7:07 AM End time: 03/07/2012 7:11 AM Reason for block: procedure for pain  Staffing Anesthesiologist: Sandrea Hughs  Preanesthetic Checklist Completed: patient identified, site marked, surgical consent, pre-op evaluation, timeout performed, IV checked, risks and benefits discussed and monitors and equipment checked  Epidural Patient position: sitting Prep: site prepped and draped and DuraPrep Patient monitoring: continuous pulse ox and blood pressure Approach: midline Injection technique: LOR air  Needle:  Needle type: Tuohy  Needle gauge: 17 G Needle length: 9 cm Needle insertion depth: 5 cm cm Catheter type: closed end flexible Catheter size: 19 Gauge Catheter at skin depth: 10 cm Test dose: negative and Other  Assessment Sensory level: T10 Events: blood not aspirated, injection not painful, no injection resistance, negative IV test and no paresthesia

## 2012-03-07 NOTE — Progress Notes (Signed)
  Subjective: Comfortable with epidural.  Partner at bedside.  Objective: BP 110/71  Pulse 103  Temp(Src) 98.2 F (36.8 C) (Oral)  Resp 18  Ht 5\' 2"  (1.575 m)  Wt 152 lb (68.947 kg)  BMI 27.80 kg/m2  SpO2 97%  LMP 05/24/2011      FHT: Category 1, occasional mild variables UC:   regular, every 2-3 minutes SVE:   Dilation: 8 Effacement (%): 100 Station: -1 Exam by:: Nigel Bridgeman, CNM No membranes felt--leaking clear fluid.  Labs: Lab Results  Component Value Date   WBC 13.9* 03/07/2012   HGB 10.2* 03/07/2012   HCT 32.4* 03/07/2012   MCV 89.3 03/07/2012   PLT 225 03/07/2012    Assessment / Plan: Spontaneous labor, progressing normally Will continue to observe.    Jarry Manon 03/07/2012, 8:50 AM

## 2012-03-07 NOTE — Progress Notes (Signed)
Trans to Rm 143 via stretcher.  Accompanied by baby, family & personal belongings.

## 2012-03-07 NOTE — Plan of Care (Signed)
Problem: Consults Goal: Birthing Suites Patient Information Press F2 to bring up selections list Outcome: Completed/Met Date Met:  03/07/12  Pt 37-[redacted] weeks EGA

## 2012-03-07 NOTE — Progress Notes (Signed)
Recovery started @ 1528.

## 2012-03-07 NOTE — MAU Note (Signed)
Pt states contractions every 5 minutes since 0100 this morning. Denies leaking of fluid or vaginal bleeding. Was dilated 3cm in office on Wednesday.

## 2012-03-07 NOTE — Anesthesia Preprocedure Evaluation (Signed)
Anesthesia Evaluation  Patient identified by MRN, date of birth, ID band Patient awake    Reviewed: Allergy & Precautions, H&P , NPO status , Patient's Chart, lab work & pertinent test results  Airway Mallampati: I TM Distance: >3 FB Neck ROM: full    Dental No notable dental hx.    Pulmonary  breath sounds clear to auscultation  Pulmonary exam normal       Cardiovascular negative cardio ROS      Neuro/Psych negative neurological ROS  negative psych ROS   GI/Hepatic negative GI ROS, Neg liver ROS,   Endo/Other  negative endocrine ROS  Renal/GU negative Renal ROS  negative genitourinary   Musculoskeletal negative musculoskeletal ROS (+)   Abdominal Normal abdominal exam  (+)   Peds negative pediatric ROS (+)  Hematology negative hematology ROS (+)   Anesthesia Other Findings   Reproductive/Obstetrics (+) Pregnancy                           Anesthesia Physical Anesthesia Plan  ASA: II  Anesthesia Plan: Epidural   Post-op Pain Management:    Induction:   Airway Management Planned:   Additional Equipment:   Intra-op Plan:   Post-operative Plan:   Informed Consent: I have reviewed the patients History and Physical, chart, labs and discussed the procedure including the risks, benefits and alternatives for the proposed anesthesia with the patient or authorized representative who has indicated his/her understanding and acceptance.     Plan Discussed with:   Anesthesia Plan Comments:         Anesthesia Quick Evaluation  

## 2012-03-07 NOTE — Progress Notes (Signed)
Belenda Cruise, RNC (charge) monitoring pt while I eat lunch.
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
## 2012-03-08 ENCOUNTER — Encounter (HOSPITAL_COMMUNITY): Payer: Self-pay | Admitting: *Deleted

## 2012-03-08 DIAGNOSIS — O9081 Anemia of the puerperium: Secondary | ICD-10-CM | POA: Diagnosis not present

## 2012-03-08 LAB — CBC
MCH: 27.9 pg (ref 26.0–34.0)
MCHC: 31.2 g/dL (ref 30.0–36.0)
RDW: 14.3 % (ref 11.5–15.5)

## 2012-03-08 NOTE — Progress Notes (Signed)
Post Partum Day 1--s/p SVB Subjective: No complaints.  Denies syncope or dizziness.  Declines contraception at present.  Hopes for d/c this pm--hasn't seen pediatrician yet.  Breast and bottle feeding.  Objective: Blood pressure 103/64, pulse 86, temperature 98.4 F (36.9 C), temperature source Oral, resp. rate 20, height 5\' 2"  (1.575 m), weight 152 lb (68.947 kg), last menstrual period 05/24/2011, SpO2 97.00%, unknown if currently breastfeeding. Orthostatics stable.  Physical Exam:  General: alert Lochia: appropriate Uterine Fundus: firm Incision: healing well DVT Evaluation: No evidence of DVT seen on physical exam. Negative Homan's sign.   Basename 03/08/12 0515 03/07/12 0636  HGB 8.2* 10.2*  HCT 26.3* 32.4*    Assessment/Plan: Will await pediatrician decision regarding discharge--OK for d/c from obstetrical standpoint.    LOS: 1 day   Lauren Booker 03/08/2012, 10:09 AM

## 2012-03-08 NOTE — Discharge Summary (Signed)
Obstetric Discharge Summary Reason for Admission: onset of labor Prenatal Procedures: NST and ultrasound Intrapartum Procedures: spontaneous vaginal delivery Postpartum Procedures: none Complications-Operative and Postpartum: Bilateral periuretheral, 1st degree vaginal degree perineal laceration Hemoglobin  Date Value Range Status  03/08/2012 8.2* 12.0-15.0 (g/dL) Final     DELTA CHECK NOTED     REPEATED TO VERIFY     HCT  Date Value Range Status  03/08/2012 26.3* 36.0-46.0 (%) Final   Hgb before delivery:  10.2  Hospital Course: Admitted 03/07/12 in early labor. Positive GBS. Progressed spontaneously, with epidural placed. Delivery was performed by Nigel Bridgeman, CNM, without complication. Patient and baby tolerated the procedure without difficulty, with  Bilateral periuretheral lacerations and 1st degree right vaginal sidewall laceration noted. Infant to FTN. Mother and infant then had an uncomplicated postpartum course, with breast and bottle feeding going well. Mom's physical exam was WNL, and she was discharged home in stable condition. Contraception plan was undecided at the time of discharge, with patient currently in same sex relationship.  She received adequate benefit from po pain medications and was sent home with Ibuprophen.   Physical Exam:  General: alert Lochia: appropriate Uterine Fundus: firm Incision: healing well DVT Evaluation: No evidence of DVT seen on physical exam. Negative Homan's sign.  Discharge Diagnoses: Term Pregnancy-delivered and mild anemia  Discharge Information: Date: 03/09/12 Activity: Per [facility] handout Diet: routine Medications: Ibuprofen, OTC Fe Condition: stable Instructions: refer to practice specific booklet Discharge to: home Contraception:  Undecided (currently in same sex relationship) Follow-up Information    Follow up with [facility] in 6 weeks. (Call as needed .)          Newborn Data: Live born female  Birth Weight: 6  lb 14.6 oz (3135 g) APGAR: 9, 9  Home with mother.  Nigel Bridgeman 03/08/12 5p  Addendum: Discharge was held secondary to infant not being discharged. Pt remains stable with no c/o PE WNL  Declines any contraception

## 2012-03-08 NOTE — Anesthesia Postprocedure Evaluation (Signed)
  Anesthesia Post-op Note  Patient: Lauren Booker  Procedure(s) Performed: * No procedures listed *  Patient Location: Mother/Baby  Anesthesia Type: Epidural  Level of Consciousness: awake, alert  and oriented  Airway and Oxygen Therapy: Patient Spontanous Breathing  Post-op Pain: mild  Post-op Assessment: Patient's Cardiovascular Status Stable, Respiratory Function Stable, Patent Airway, No signs of Nausea or vomiting and Pain level controlled  Post-op Vital Signs: stable  Complications: No apparent anesthesia complications

## 2012-03-09 MED ORDER — IBUPROFEN 600 MG PO TABS: 600.0000 mg | ORAL_TABLET | Freq: Four times a day (QID) | ORAL | Status: AC

## 2012-03-09 NOTE — Discharge Instructions (Signed)
Postpartum Care After Vaginal Delivery After you deliver your baby, you will stay in the hospital for 24 to 72 hours, unless there were problems with the labor or delivery, or you have medical problems. While you are in the hospital, you will receive help and instructions on how to care for yourself and your baby. Your doctor will order pain medicine, in case you need it. You will have a small amount of bleeding from your vagina and should change your sanitary pad frequently. Wash your hands thoroughly with soap and water for at least 20 seconds after changing pads and using the toilet. Let the nurses know if you begin to pass blood clots or your bleeding increases. Do not flush blood clots down the toilet before having the nurse look at them, to make sure there is no placental tissue with them. If you had an intravenous (IV), it will be removed within 24 hours, if there are no problems. The first time you get out of bed or take a shower, call the nurse to help you because you may get weak, lightheaded, or even faint. If you are breastfeeding, you may feel painful contractions of your uterus for a couple of weeks. This is normal. The contractions help your uterus get back to normal size. If you are not breastfeeding, wear a supportive bra and handle your breasts as little as possible until your milk has dried up. Hormones should not be given to dry up the breasts, because they can cause blood clots. You will be given your normal diet, unless you have diabetes or other medical problems.  The nurses may put an ice pack on your episiotomy (surgically enlarged opening), if you have one, to reduce the pain and swelling. On rare occasions, you may not be able to urinate and the nurse will need to empty your bladder with a catheter. If you had a postpartum tubal ligation ("tying tubes," female sterilization), it should not make your stay in the hospital longer. You may have your baby in your room with you as much as  you like, unless you or the baby has a problem. Use the bassinet (basket) for the baby when going to and from the nursery. Do not carry the baby. Do not leave the postpartum area. If the mother is Rh negative (lacks a protein on the red blood cells) and the baby is Rh positive, the mother should get a Rho-gam shot to prevent Rh problems with future pregnancies. You may be given written instructions for you and your baby, and necessary medicines, when you are discharged from the hospital. Be sure you understand and follow the instructions as advised. HOME CARE INSTRUCTIONS   Follow instructions and take the medicines given to you.   Only take over-the-counter or prescription medicines for pain, discomfort, or fever as directed by your caregiver.   Do not take aspirin, because it can cause bleeding.   Increase your activities a little bit every day to build up your strength and endurance.   Do not drink alcohol, especially if you are breastfeeding or taking pain medicine.   Take your temperature twice a day and record it.   You may have a small amount of bleeding or spotting for 2 to 4 weeks. This is normal.   Do not use tampons or douche. Use sanitary pads.   Try to have someone stay and help you for a few days when you go home.   Try to rest or take a nap when   the baby is sleeping.   If you are breastfeeding, wear a good support bra. If you are not breastfeeding, wear a supportive bra and do not stimulate your nipples.   Eat a healthy, nutritious diet and continue to take your prenatal vitamins.   Do not drive, do any heavy activities, or travel until your caregiver tells you it is okay.   Do not have intercourse until your caregiver gives you permission to do so.   Ask your caregiver when you can begin to exercise and what type of exercises to do.   Call your caregiver if you think you are having a problem from your delivery.   Call your pediatrician if you are having a problem  with the baby.   Schedule your postpartum visit and keep it.  SEEK MEDICAL CARE IF:   You have a temperature of 100 F (37.8 C) or higher.   You have increased vaginal bleeding or are passing clots. Save any clots to show your caregiver.   You have bloody urine or pain when you urinate.   You have a bad smelling vaginal discharge.   You have increasing pain or swelling on your episiotomy.   You develop a severe headache.   You feel depressed.   The episiotomy is separating.   You become dizzy or lightheaded.   You develop a rash.   You have a reaction or problems with your medicine.   You have pain, redness, or swelling at the intravenous site.  SEEK IMMEDIATE MEDICAL CARE IF:   You have chest pain.   You develop shortness of breath.   You pass out.   You develop pain, with or without swelling or redness in your leg.   You develop heavy vaginal bleeding, with or without blood clots.   You develop stomach pain.   You develop a bad smelling vaginal discharge.  MAKE SURE YOU:   Understand these instructions.   Will watch your condition.   Will get help right away if you are not doing well or get worse.  Document Released: 08/18/2007 Document Revised: 10/10/2011 Document Reviewed: 08/30/2009 Exit[employer] Patient Information 2012 [employer]. Breastfeeding BENEFITS OF BREASTFEEDING For the baby  The first milk (colostrum) helps the baby's digestive system function better.   There are antibodies from the mother in the milk that help the baby fight off infections.   The baby has a lower incidence of asthma, allergies, and SIDS (sudden infant death syndrome).   The nutrients in breast milk are better than formulas for the baby and helps the baby's brain grow better.   Babies who breastfeed have less gas, colic, and constipation.  For the mother  Breastfeeding helps develop a very special bond between mother and baby.   It is more convenient, always  available at the correct temperature and cheaper than formula feeding.   It burns calories in the mother and helps with losing weight that was gained during pregnancy.   It makes the uterus contract back down to normal size faster and slows bleeding following delivery.   Breastfeeding mothers have a lower risk of developing breast cancer.  NURSE FREQUENTLY  A healthy, full-term baby may breastfeed as often as every hour or space his or her feedings to every 3 hours.   How often to nurse will vary from baby to baby. Watch your baby for signs of hunger, not the clock.   Nurse as often as the baby requests, or when you feel the need to   reduce the fullness of your breasts.   Awaken the baby if it has been 3 to 4 hours since the last feeding.   Frequent feeding will help the mother make more milk and will prevent problems like sore nipples and engorgement of the breasts.  BABY'S POSITION AT THE BREAST  Whether lying down or sitting, be sure that the baby's tummy is facing your tummy.   Support the breast with 4 fingers underneath the breast and the thumb above. Make sure your fingers are well away from the nipple and baby's mouth.   Stroke the baby's lips and cheek closest to the breast gently with your finger or nipple.   When the baby's mouth is open wide enough, place all of your nipple and as much of the dark area around the nipple as possible into your baby's mouth.   Pull the baby in close so the tip of the nose and the baby's cheeks touch the breast during the feeding.  FEEDINGS  The length of each feeding varies from baby to baby and from feeding to feeding.   The baby must suck about 2 to 3 minutes for your milk to get to him or her. This is called a "let down." For this reason, allow the baby to feed on each breast as long as he or she wants. Your baby will end the feeding when he or she has received the right balance of nutrients.   To break the suction, put your finger into  the corner of the baby's mouth and slide it between his or her gums before removing your breast from his or her mouth. This will help prevent sore nipples.  REDUCING BREAST ENGORGEMENT  In the first week after your baby is born, you may experience signs of breast engorgement. When breasts are engorged, they feel heavy, warm, full, and may be tender to the touch. You can reduce engorgement if you:   Nurse frequently, every 2 to 3 hours. Mothers who breastfeed early and often have fewer problems with engorgement.   Place light ice packs on your breasts between feedings. This reduces swelling. Wrap the ice packs in a lightweight towel to protect your skin.   Apply moist hot packs to your breast for 5 to 10 minutes before each feeding. This increases circulation and helps the milk flow.   Gently massage your breast before and during the feeding.   Make sure that the baby empties at least one breast at every feeding before switching sides.   Use a breast pump to empty the breasts if your baby is sleepy or not nursing well. You may also want to pump if you are returning to work or or you feel you are getting engorged.   Avoid bottle feeds, pacifiers or supplemental feedings of water or juice in place of breastfeeding.   Be sure the baby is latched on and positioned properly while breastfeeding.   Prevent fatigue, stress, and anemia.   Wear a supportive bra, avoiding underwire styles.   Eat a balanced diet with enough fluids.  If you follow these suggestions, your engorgement should improve in 24 to 48 hours. If you are still experiencing difficulty, call your lactation consultant or caregiver. IS MY BABY GETTING ENOUGH MILK? Sometimes, mothers worry about whether their babies are getting enough milk. You can be assured that your baby is getting enough milk if:  The baby is actively sucking and you hear swallowing.   The baby nurses at least 8 to   12 times in a 24 hour time period. Nurse your  baby until he or she unlatches or falls asleep at the first breast (at least 10 to 20 minutes), then offer the second side.   The baby is wetting 5 to 6 disposable diapers (6 to 8 cloth diapers) in a 24 hour period by 5 to 6 days of age.   The baby is having at least 2 to 3 stools every 24 hours for the first few months. Breast milk is all the food your baby needs. It is not necessary for your baby to have water or formula. In fact, to help your breasts make more milk, it is best not to give your baby supplemental feedings during the early weeks.   The stool should be soft and yellow.   The baby should gain 4 to 7 ounces per week after he is 4 days old.  TAKE CARE OF YOURSELF Take care of your breasts by:  Bathing or showering daily.   Avoiding the use of soaps on your nipples.   Start feedings on your left breast at one feeding and on your right breast at the next feeding.   You will notice an increase in your milk supply 2 to 5 days after delivery. You may feel some discomfort from engorgement, which makes your breasts very firm and often tender. Engorgement "peaks" out within 24 to 48 hours. In the meantime, apply warm moist towels to your breasts for 5 to 10 minutes before feeding. Gentle massage and expression of some milk before feeding will soften your breasts, making it easier for your baby to latch on. Wear a well fitting nursing bra and air dry your nipples for 10 to 15 minutes after each feeding.   Only use cotton bra pads.   Only use pure lanolin on your nipples after nursing. You do not need to wash it off before nursing.  Take care of yourself by:   Eating well-balanced meals and nutritious snacks.   Drinking milk, fruit juice, and water to satisfy your thirst (about 8 glasses a day).   Getting plenty of rest.   Increasing calcium in your diet (1200 mg a day).   Avoiding foods that you notice affect the baby in a bad way.  SEEK MEDICAL CARE IF:   You have any  questions or difficulty with breastfeeding.   You need help.   You have a hard, red, sore area on your breast, accompanied by a fever of 100.5 F (38.1 C) or more.   Your baby is too sleepy to eat well or is having trouble sleeping.   Your baby is wetting less than 6 diapers per day, by 5 days of age.   Your baby's skin or white part of his or her eyes is more yellow than it was in the hospital.   You feel depressed.  Document Released: 10/21/2005 Document Revised: 10/10/2011 Document Reviewed: 06/05/2009 Exit[employer] Patient Information 2012 [employer]. 

## 2012-03-09 NOTE — Progress Notes (Signed)
Patient delivered on 03/07/12 at 1445 a female vaginally apgars 9/9 6 lb 14 oz

## 2012-03-11 ENCOUNTER — Encounter: Payer: Medicaid Other | Admitting: Obstetrics and Gynecology

## 2012-04-10 ENCOUNTER — Ambulatory Visit: Payer: Medicaid Other | Admitting: Obstetrics and Gynecology

## 2012-05-01 ENCOUNTER — Encounter: Payer: Self-pay | Admitting: Obstetrics and Gynecology

## 2012-05-01 ENCOUNTER — Ambulatory Visit (INDEPENDENT_AMBULATORY_CARE_PROVIDER_SITE_OTHER): Payer: Medicaid Other | Admitting: Obstetrics and Gynecology

## 2012-05-01 MED ORDER — METOCLOPRAMIDE HCL 10 MG PO TABS: 10.0000 mg | ORAL_TABLET | Freq: Four times a day (QID) | ORAL | Status: DC

## 2012-05-01 NOTE — Progress Notes (Signed)
21 y/o post partum

## 2012-05-01 NOTE — Progress Notes (Signed)
RX regan for decreased milk supply discussed, exercise, declines birth control, f/o annually. Lavera Guise, CNM

## 2012-05-01 NOTE — Progress Notes (Signed)
Date of delivery: 03/07/12 Female Name: Lauren Booker Vaginal delivery:yes Cesarean section:no Tubal ligation:no GDM:no Breast Feeding:yes Bottle Feeding:no Post-Partum Blues:no Abnormal pap:yes Normal GU function: no Normal GI function:no Returning to work:yes EPDS: 2

## 2012-05-01 NOTE — Progress Notes (Signed)
21 y/o now G1P1 s/p SVD 03/07/12 Affect good no signs of pp depression CVS: Norm Lungs: Bilateral clear Abdominal: soft and B/s norm Vulva: WNL Labial Lacs healed and Left vaginal wall lac healed No PV bleeding No cycle as yet Declined Birth control (same sex couple) Advised on Breast feeding and how to increase breast milk flow Patient has asked for [employer]

## 2012-11-04 NOTE — L&D Delivery Note (Signed)
Delivery Note At 12:42 AM a viable female was delivered via Vaginal, Spontaneous Delivery (Presentation: ; Occiput Anterior).  APGAR: 7, 9; weight pending.   Placenta status: Intact, Spontaneous.  Cord: 3 vessels with the following complications: None.  Cord pH: not collected  Anesthesia: Epidural  Episiotomy: None Lacerations: Vaginal Suture Repair: 3.0 vicryl Est. Blood Loss (mL): 200  Mom to postpartum.  Baby to skin to skin immediately after delivery.  Routine PP orders Breastfeeding Outpatient Circ    Lauren Booker 07/27/2013, 1:14 AM

## 2012-11-25 ENCOUNTER — Encounter (HOSPITAL_COMMUNITY): Payer: Self-pay | Admitting: *Deleted

## 2012-11-25 ENCOUNTER — Emergency Department (INDEPENDENT_AMBULATORY_CARE_PROVIDER_SITE_OTHER)
Admission: EM | Admit: 2012-11-25 | Discharge: 2012-11-25 | Disposition: A | Discharge status: Home / Self Care | Payer: Medicaid Other | Source: Home / Self Care

## 2012-11-25 DIAGNOSIS — N12 Tubulo-interstitial nephritis, not specified as acute or chronic: Secondary | ICD-10-CM

## 2012-11-25 DIAGNOSIS — N390 Urinary tract infection, site not specified: Secondary | ICD-10-CM

## 2012-11-25 LAB — CBC WITH DIFFERENTIAL/PLATELET
Basophils Absolute: 0 10*3/uL (ref 0.0–0.1)
Eosinophils Absolute: 0.1 10*3/uL (ref 0.0–0.7)
Eosinophils Relative: 1 % (ref 0–5)
Lymphs Abs: 0.7 10*3/uL (ref 0.7–4.0)
MCH: 30.2 pg (ref 26.0–34.0)
MCV: 90.2 fL (ref 78.0–100.0)
Neutrophils Relative %: 83 % — ABNORMAL HIGH (ref 43–77)
Platelets: 366 10*3/uL (ref 150–400)
RBC: 4.61 MIL/uL (ref 3.87–5.11)
RDW: 13.2 % (ref 11.5–15.5)
WBC: 10.1 10*3/uL (ref 4.0–10.5)

## 2012-11-25 LAB — POCT I-STAT, CHEM 8
Creatinine, Ser: 0.8 mg/dL (ref 0.50–1.10)
HCT: 45 % (ref 36.0–46.0)
Hemoglobin: 15.3 g/dL — ABNORMAL HIGH (ref 12.0–15.0)
Potassium: 3.7 mEq/L (ref 3.5–5.1)
Sodium: 140 mEq/L (ref 135–145)
TCO2: 25 mmol/L (ref 0–100)

## 2012-11-25 LAB — POCT URINALYSIS DIP (DEVICE)
Bilirubin Urine: NEGATIVE
Glucose, UA: NEGATIVE mg/dL
Nitrite: POSITIVE — AB
Urobilinogen, UA: 0.2 mg/dL (ref 0.0–1.0)

## 2012-11-25 MED ORDER — CEFTRIAXONE SODIUM 1 G IJ SOLR
1.0000 g | Freq: Once | INTRAMUSCULAR | Status: AC
  Administered 2012-11-25: 1 g via INTRAMUSCULAR

## 2012-11-25 MED ORDER — CEFTRIAXONE SODIUM 1 G IJ SOLR
INTRAMUSCULAR | Status: AC
  Filled 2012-11-25: qty 10

## 2012-11-25 MED ORDER — LIDOCAINE HCL (PF) 1 % IJ SOLN
INTRAMUSCULAR | Status: AC
  Filled 2012-11-25: qty 5

## 2012-11-25 MED ORDER — CEPHALEXIN 500 MG PO CAPS: ORAL_CAPSULE | ORAL | Status: DC

## 2012-11-25 NOTE — ED Provider Notes (Signed)
Medical screening examination/treatment/procedure(s) were performed by non-physician practitioner and as supervising physician I was immediately available for consultation/collaboration.  Dorthie Santini   Viviene Thurston, MD 11/25/12 1338 

## 2012-11-25 NOTE — ED Provider Notes (Addendum)
History     CSN: [id number]  Arrival date & time 11/25/12  1022   None     Chief Complaint  Patient presents with  . Fatigue  . Back Pain    (Consider location/radiation/quality/duration/timing/severity/associated sxs/prior treatment) HPI Comments: 22 year old female  complaining of bilateral back pain for one to 2 weeks. It has been constant but not getting worse. 2 days ago she started feeling unusually weak. She awoke with weakness and had to lie back down. Associated symptoms include dizziness, nausea without vomiting. She is also complaining of a pelvic pain  in the midline and on the right for one to 2 weeks. She denies vaginal discharge. She denies URI symptoms such as cough, earache, sore throat, nasal congestion or runny nose. She does complain of urinary frequency and pressure post void.   Past Medical History  Diagnosis Date  . Asthma   . Herpes simplex     last outbreak 2011    Past Surgical History  Procedure Date  . No past surgeries     Family History  Problem Relation Age of Onset  . Hypertension Maternal Grandmother   . Diabetes Maternal Grandmother   . Hypertension Paternal Grandmother   . Diabetes Paternal Grandmother   . Anesthesia problems Neg Hx     History  Substance Use Topics  . Smoking status: Passive Smoke Exposure - Never Smoker  . Smokeless tobacco: Never Used  . Alcohol Use: No    OB History    Grav Para Term Preterm Abortions TAB SAB Ect Mult Living   1 1 1  0 0 0 0 0 0 1      Review of Systems  Constitutional: Positive for activity change and fatigue. Negative for fever and chills.  HENT: Negative.   Eyes: Negative.   Respiratory: Positive for shortness of breath.   Cardiovascular: Negative.   Gastrointestinal: Positive for nausea. Negative for vomiting, abdominal pain, diarrhea, constipation and rectal pain.  Genitourinary: Positive for frequency, flank pain and pelvic pain. Negative for vaginal bleeding, vaginal discharge  and vaginal pain.  Musculoskeletal: Negative.   Neurological: Negative.   Psychiatric/Behavioral: Negative.     Allergies  Review of patient's allergies indicates no known allergies.  Home Medications   Current Outpatient Rx  Name  Route  Sig  Dispense  Refill  . ACETAMINOPHEN 500 MG PO TABS   Oral   Take 500-1,000 mg by mouth every 6 (six) hours as needed. For pain         . ALBUTEROL SULFATE HFA 108 (90 BASE) MCG/ACT IN AERS   Inhalation   Inhale 2 puffs into the lungs every 6 (six) hours as needed. For SOB         . CEPHALEXIN 500 MG PO CAPS      1 cap QID for 8 days   32 capsule   0   . METOCLOPRAMIDE HCL 10 MG PO TABS   Oral   Take 1 tablet (10 mg total) by mouth 4 (four) times daily.   90 tablet   1   . PRENATAL MULTIVITAMIN CH   Oral   Take 1 tablet by mouth daily.         Marland Kitchen VALACYCLOVIR HCL 500 MG PO TABS   Oral   Take 500 mg by mouth daily as needed. For outbreaks           BP 90/66  Pulse 115  Temp 98.1 F (36.7 C) (Oral)  Resp 16  Ht 5'  5" (1.651 m)  Wt 125 lb (56.7 kg)  BMI 20.80 kg/m2  SpO2 100%  LMP 11/08/2012  Breastfeeding? No  Physical Exam  Nursing note and vitals reviewed. Constitutional: She is oriented to person, place, and time. She appears well-developed and well-nourished. No distress.  HENT:  Nose: Nose normal.  Mouth/Throat: Oropharynx is clear and moist. No oropharyngeal exudate.  Eyes: Conjunctivae normal and EOM are normal.  Neck: Normal range of motion. Neck supple.  Cardiovascular: Normal rate and normal heart sounds.   Pulmonary/Chest: Effort normal and breath sounds normal.  Abdominal: Soft. She exhibits no distension. There is no tenderness. There is no rebound and no guarding.  Genitourinary:       External pelvic exam reveals minor suprapubic tenderness. There is no tenderness in the left the right pelvis.  Musculoskeletal: Normal range of motion. She exhibits no edema and no tenderness.  Lymphadenopathy:     She has no cervical adenopathy.  Neurological: She is alert and oriented to person, place, and time.  Skin: Skin is warm and dry.  Psychiatric: She has a normal mood and affect.    ED Course  Procedures (including critical care time)  Labs Reviewed  POCT URINALYSIS DIP (DEVICE) - Abnormal; Notable for the following:    Ketones, ur 15 (*)     pH 8.5 (*)     Nitrite POSITIVE (*)     Leukocytes, UA TRACE (*)  Biochemical Testing Only. Please order routine urinalysis from main lab if confirmatory testing is needed.   All other components within normal limits  CBC WITH DIFFERENTIAL - Abnormal; Notable for the following:    Neutrophils Relative 83 (*)     Neutro Abs 8.4 (*)     Lymphocytes Relative 7 (*)     All other components within normal limits  POCT I-STAT, CHEM 8 - Abnormal; Notable for the following:    Hemoglobin 15.3 (*)     All other components within normal limits  POCT PREGNANCY, URINE  URINE CULTURE   No results found.   1. UTI (lower urinary tract infection)   2. Pyelonephritis       MDM  Rocephin 1 g IM now Keflex 500 mg 4 times a day for 8 days Drink plenty of fluids stay well hydrated For any new symptoms problems or worsening may return.  the patient most likely has an early pyelonephritis. She certainly has  UTI symptoms followed by  acute weakness 2 days ago and this is associated with bilateral flank pain. She denies fever at home and she is afebrile here. She has mild tachycardia and it was suggested that she may have not have had adequate intake of fluids recently. According to the lab she has a mild increase in neutrophils but otherwise total white count is normal. Electrolytes are normal. She is able to drink fluids, afebrile, stable and appears well enough to go home. She  appears mildly ill.  is not toxic. Results for orders placed during the hospital encounter of 11/25/12  POCT URINALYSIS DIP (DEVICE)      Component Value Range   Glucose, UA  NEGATIVE  NEGATIVE mg/dL   Bilirubin Urine NEGATIVE  NEGATIVE   Ketones, ur 15 (*) NEGATIVE mg/dL   Specific Gravity, Urine 1.015  1.005 - 1.030   Hgb urine dipstick NEGATIVE  NEGATIVE   pH 8.5 (*) 5.0 - 8.0   Protein, ur NEGATIVE  NEGATIVE mg/dL   Urobilinogen, UA 0.2  0.0 - 1.0 mg/dL  Nitrite POSITIVE (*) NEGATIVE   Leukocytes, UA TRACE (*) NEGATIVE  POCT PREGNANCY, URINE      Component Value Range   Preg Test, Ur NEGATIVE  NEGATIVE  CBC WITH DIFFERENTIAL      Component Value Range   WBC 10.1  4.0 - 10.5 K/uL   RBC 4.61  3.87 - 5.11 MIL/uL   Hemoglobin 13.9  12.0 - 15.0 g/dL   HCT 16.1  09.6 - 04.5 %   MCV 90.2  78.0 - 100.0 fL   MCH 30.2  26.0 - 34.0 pg   MCHC 33.4  30.0 - 36.0 g/dL   RDW 40.9  81.1 - 91.4 %   Platelets 366  150 - 400 K/uL   Neutrophils Relative 83 (*) 43 - 77 %   Neutro Abs 8.4 (*) 1.7 - 7.7 K/uL   Lymphocytes Relative 7 (*) 12 - 46 %   Lymphs Abs 0.7  0.7 - 4.0 K/uL   Monocytes Relative 9  3 - 12 %   Monocytes Absolute 0.9  0.1 - 1.0 K/uL   Eosinophils Relative 1  0 - 5 %   Eosinophils Absolute 0.1  0.0 - 0.7 K/uL   Basophils Relative 0  0 - 1 %   Basophils Absolute 0.0  0.0 - 0.1 K/uL  POCT I-STAT, CHEM 8      Component Value Range   Sodium 140  135 - 145 mEq/L   Potassium 3.7  3.5 - 5.1 mEq/L   Chloride 106  96 - 112 mEq/L   BUN 13  6 - 23 mg/dL   Creatinine, Ser 7.82  0.50 - 1.10 mg/dL   Glucose, Bld 90  70 - 99 mg/dL   Calcium, Ion 9.56  2.13 - 1.23 mmol/L   TCO2 25  0 - 100 mmol/L   Hemoglobin 15.3 (*) 12.0 - 15.0 g/dL   HCT 08.6  57.8 - 46.9 %            Hayden Rasmussen, NP 11/25/12 1258  Hayden Rasmussen, NP 11/27/12 2121

## 2012-11-25 NOTE — ED Notes (Signed)
MD at bedside. Pt informed of waiting for test results - denies needs at this time

## 2012-11-25 NOTE — ED Notes (Signed)
Pt reports extreme weakness and fatigue for the "past week or two" with back pain and urinary frequency.Denies vaginal discharge, fever or abdominal pain

## 2012-11-27 LAB — URINE CULTURE

## 2012-11-28 NOTE — ED Provider Notes (Signed)
Medical screening examination/treatment/procedure(s) were performed by resident physician or non-physician practitioner and as supervising physician I was immediately available for consultation/collaboration.   Barkley Bruns MD.    Linna Hoff, MD 11/28/12 1058

## 2012-12-14 NOTE — ED Notes (Signed)
Urine culture: >100,000 colonies E. Coli.  Pt. adequately treated with Keflex. Lauren Booker 12/14/2012

## 2013-01-06 ENCOUNTER — Inpatient Hospital Stay (HOSPITAL_COMMUNITY)
Admission: AD | Admit: 2013-01-06 | Discharge: 2013-01-06 | Disposition: A | Discharge status: Home / Self Care | Payer: Medicaid Other | Source: Ambulatory Visit | Attending: Obstetrics and Gynecology | Admitting: Obstetrics and Gynecology

## 2013-01-06 ENCOUNTER — Encounter (HOSPITAL_COMMUNITY): Payer: Self-pay

## 2013-01-06 DIAGNOSIS — O210 Mild hyperemesis gravidarum: Secondary | ICD-10-CM | POA: Insufficient documentation

## 2013-01-06 LAB — URINE MICROSCOPIC-ADD ON

## 2013-01-06 LAB — URINALYSIS, ROUTINE W REFLEX MICROSCOPIC
Bilirubin Urine: NEGATIVE
Glucose, UA: NEGATIVE mg/dL
Hgb urine dipstick: NEGATIVE
Specific Gravity, Urine: >1.03 — ABNORMAL HIGH (ref 1.005–1.030)
Urobilinogen, UA: 0.2 mg/dL (ref 0.0–1.0)
pH: 6.5 (ref 5.0–8.0)

## 2013-01-06 LAB — POCT PREGNANCY, URINE: Preg Test, Ur: POSITIVE — AB

## 2013-01-06 MED ORDER — PROMETHAZINE HCL 25 MG PO TABS: 25.0000 mg | ORAL_TABLET | Freq: Four times a day (QID) | ORAL | Status: DC | PRN

## 2013-01-06 MED ORDER — ONDANSETRON HCL 8 MG PO TABS: 8.0000 mg | ORAL_TABLET | Freq: Three times a day (TID) | ORAL | Status: DC | PRN

## 2013-01-06 MED ORDER — OXYCODONE-ACETAMINOPHEN 5-325 MG PO TABS: 1.0000 | ORAL_TABLET | ORAL | Status: DC | PRN

## 2013-01-06 MED ORDER — ACETAMINOPHEN 325 MG PO TABS
650.0000 mg | ORAL_TABLET | Freq: Four times a day (QID) | ORAL | Status: DC | PRN
  Administered 2013-01-06: 650 mg via ORAL

## 2013-01-06 MED ORDER — SODIUM CHLORIDE 0.9 % IV SOLN
25.0000 mg | Freq: Once | INTRAVENOUS | Status: AC
  Administered 2013-01-06: 25 mg via INTRAVENOUS
  Filled 2013-01-06: qty 1

## 2013-01-06 NOTE — MAU Note (Signed)
Name and DOB verified, pt confirmed spelling is correct on ID band. 

## 2013-01-06 NOTE — MAU Note (Signed)
Has not used the restroom today

## 2013-01-06 NOTE — MAU Note (Signed)
ongoing problems with nausea and vomiting.  Vomits 3-4 times a day.  Can't keep anything down.  Dizzy a lot and has bad headaches.

## 2013-01-06 NOTE — MAU Provider Note (Signed)
History     CSN: [id number]  Arrival date and time: 01/06/13 1710   First Provider Initiated Contact with Patient 01/06/13 1805      Chief Complaint  Patient presents with  . Emesis   HPI This is a 22 y.o. female at [redacted]w[redacted]d who presents with c/o nausea and vomiting for several weeks. States it gives her headaches and dizziness. Did not have any nausea with other pregnancy. Denies pain or bleeding. Waiting for Medicaid to come in before getting appointment with [facility].   RN Note:  ongoing problems with nausea and vomiting. Vomits 3-4 times a day. Can't keep anything down. Dizzy a lot and has bad headaches.       OB History   Grav Para Term Preterm Abortions TAB SAB Ect Mult Living   2 1 1  0 0 0 0 0 0 1      Past Medical History  Diagnosis Date  . Asthma   . Herpes simplex     last outbreak 2011    Past Surgical History  Procedure Laterality Date  . No past surgeries      Family History  Problem Relation Age of Onset  . Hypertension Maternal Grandmother   . Diabetes Maternal Grandmother   . Hypertension Paternal Grandmother   . Diabetes Paternal Grandmother   . Anesthesia problems Neg Hx     History  Substance Use Topics  . Smoking status: Passive Smoke Exposure - Never Smoker  . Smokeless tobacco: Never Used  . Alcohol Use: No    Allergies: No Known Allergies    Review of Systems  Constitutional: Positive for malaise/fatigue. Negative for fever and chills.  Eyes: Negative for blurred vision.  Gastrointestinal: Positive for nausea and vomiting.  Neurological: Positive for weakness and headaches.   Physical Exam   Blood pressure 116/70, pulse 95, temperature 98.4 F (36.9 C), temperature source Oral, resp. rate 16, height 5\' 4"  (1.626 m), weight 127 lb (57.607 kg), last menstrual period 11/08/2012.  Physical Exam  Constitutional: She is oriented to person, place, and time. She appears well-developed. No distress.  HENT:  Head: Normocephalic.   Cardiovascular: Normal rate.   Respiratory: Effort normal.  GI: Soft. She exhibits no distension and no mass. There is no tenderness. There is no rebound and no guarding.  Musculoskeletal: Normal range of motion.  Neurological: She is alert and oriented to person, place, and time.  Skin: Skin is warm and dry.  Psychiatric: She has a normal mood and affect.    MAU Course  Procedures  MDM Given Phenergan infusion in one liter of IVF.  Was able to keep down crackers and liquids afterward.   Assessment and Plan  A:  SIUP at [redacted]w[redacted]d        Nausea and vomiting of pregnancy  P:  Discharge home       Rx meds for nausea (zofran during day and Phenergan at night) and for headache PRN        Proof of pregnancy letter   Medication List    STOP taking these medications       albuterol 108 (90 BASE) MCG/ACT inhaler  Commonly known as:  PROVENTIL HFA;VENTOLIN HFA     cephALEXin 500 MG capsule  Commonly known as:  KEFLEX     metoCLOPramide 10 MG tablet  Commonly known as:  REGLAN     valACYclovir 500 MG tablet  Commonly known as:  VALTREX      TAKE these medications  ondansetron 8 MG tablet  Commonly known as:  ZOFRAN  Take 1 tablet (8 mg total) by mouth every 8 (eight) hours as needed for nausea.     oxyCODONE-acetaminophen 5-325 MG per tablet  Commonly known as:  ROXICET  Take 1 tablet by mouth every 4 (four) hours as needed for pain.     prenatal multivitamin Tabs  Take 1 tablet by mouth daily.     promethazine 25 MG tablet  Commonly known as:  PHENERGAN  Take 1 tablet (25 mg total) by mouth every 6 (six) hours as needed for nausea.      ASK your doctor about these medications       acetaminophen 500 MG tablet  Commonly known as:  TYLENOL  Take 500 mg by mouth every 6 (six) hours as needed for pain.         Wynelle Bourgeois 01/06/2013, 6:21 PM

## 2013-01-10 NOTE — MAU Provider Note (Signed)
Attestation of Attending Supervision of Advanced Practitioner: Evaluation and management procedures were performed by the PA/NP/CNM/OB Fellow under my supervision/collaboration. Chart reviewed and agree with management and plan.  Tilda Burrow 01/10/2013 8:31 PM

## 2013-01-27 ENCOUNTER — Inpatient Hospital Stay (HOSPITAL_COMMUNITY)
Admission: AD | Admit: 2013-01-27 | Discharge: 2013-01-27 | Disposition: A | Discharge status: Home / Self Care | Payer: Medicaid Other | Source: Ambulatory Visit | Attending: Obstetrics & Gynecology | Admitting: Obstetrics & Gynecology

## 2013-01-27 DIAGNOSIS — R1032 Left lower quadrant pain: Secondary | ICD-10-CM | POA: Insufficient documentation

## 2013-01-27 DIAGNOSIS — N949 Unspecified condition associated with female genital organs and menstrual cycle: Secondary | ICD-10-CM | POA: Insufficient documentation

## 2013-01-27 LAB — URINALYSIS, ROUTINE W REFLEX MICROSCOPIC
Glucose, UA: 250 mg/dL — AB
pH: 7 (ref 5.0–8.0)

## 2013-01-27 LAB — URINE MICROSCOPIC-ADD ON

## 2013-01-27 NOTE — MAU Note (Signed)
Patient state she has had left lower abdominal pain for 3 days. Has a heavy vaginal discharge with no odor.

## 2013-01-28 ENCOUNTER — Encounter (HOSPITAL_COMMUNITY): Payer: Self-pay

## 2013-01-28 ENCOUNTER — Inpatient Hospital Stay (HOSPITAL_COMMUNITY)
Admission: AD | Admit: 2013-01-28 | Discharge: 2013-01-28 | Disposition: A | Discharge status: Home / Self Care | Payer: Medicaid Other | Source: Ambulatory Visit | Attending: Obstetrics and Gynecology | Admitting: Obstetrics and Gynecology

## 2013-01-28 DIAGNOSIS — A499 Bacterial infection, unspecified: Secondary | ICD-10-CM | POA: Insufficient documentation

## 2013-01-28 DIAGNOSIS — B9689 Other specified bacterial agents as the cause of diseases classified elsewhere: Secondary | ICD-10-CM | POA: Insufficient documentation

## 2013-01-28 DIAGNOSIS — R1032 Left lower quadrant pain: Secondary | ICD-10-CM | POA: Insufficient documentation

## 2013-01-28 DIAGNOSIS — N949 Unspecified condition associated with female genital organs and menstrual cycle: Secondary | ICD-10-CM | POA: Insufficient documentation

## 2013-01-28 DIAGNOSIS — O26891 Other specified pregnancy related conditions, first trimester: Secondary | ICD-10-CM

## 2013-01-28 DIAGNOSIS — S39012A Strain of muscle, fascia and tendon of lower back, initial encounter: Secondary | ICD-10-CM

## 2013-01-28 DIAGNOSIS — N760 Acute vaginitis: Secondary | ICD-10-CM | POA: Insufficient documentation

## 2013-01-28 DIAGNOSIS — O2390 Unspecified genitourinary tract infection in pregnancy, unspecified trimester: Secondary | ICD-10-CM | POA: Insufficient documentation

## 2013-01-28 MED ORDER — ONDANSETRON 8 MG PO TBDP
8.0000 mg | ORAL_TABLET | Freq: Once | ORAL | Status: AC
  Administered 2013-01-28: 8 mg via ORAL
  Filled 2013-01-28: qty 1

## 2013-01-28 MED ORDER — CYCLOBENZAPRINE HCL 5 MG PO TABS: 5.0000 mg | ORAL_TABLET | Freq: Three times a day (TID) | ORAL | Status: DC | PRN

## 2013-01-28 MED ORDER — METRONIDAZOLE 500 MG PO TABS: 500.0000 mg | ORAL_TABLET | Freq: Two times a day (BID) | ORAL | Status: DC

## 2013-01-28 MED ORDER — AZITHROMYCIN 250 MG PO TABS
1000.0000 mg | ORAL_TABLET | Freq: Once | ORAL | Status: AC
  Administered 2013-01-28: 1000 mg via ORAL
  Filled 2013-01-28: qty 4

## 2013-01-28 NOTE — MAU Note (Signed)
Patient state she has been having lower abdominal pain for 4 days. Vomited once this am. Has a green vaginal discharge.

## 2013-01-28 NOTE — MAU Provider Note (Signed)
History     CSN: [id number]  Arrival date and time: 01/28/13 7829   First Provider Initiated Contact with Patient 01/28/13 1007      Chief Complaint  Patient presents with  . Abdominal Pain   Abdominal Pain  Pt is a 22 y/o G2P1 at [redacted]w[redacted]d who presents today with lower abdominal pain. The pain started about 3 days ago and is located in her LLQ. Onset was gradual. She rates it as 6/10 at its worst. She reports that it is worse at night while she is in bed. Changing positions makes it better. It is also worse while she is at work, where she is on her feet and is doing a lot of lifting. It sometimes radiates to her lower back. She reports a small amount of vaginal discharge, described as clear-green and is seen mostly when wiping. She denies dysuria but endorses frequency. No vaginal bleeding, itching, pain, discomfort. No fevers/chills.  Past Medical History  Diagnosis Date  . Asthma   . Herpes simplex     last outbreak 2011    Past Surgical History  Procedure Laterality Date  . No past surgeries      Family History  Problem Relation Age of Onset  . Hypertension Maternal Grandmother   . Diabetes Maternal Grandmother   . Hypertension Paternal Grandmother   . Diabetes Paternal Grandmother   . Anesthesia problems Neg Hx     History  Substance Use Topics  . Smoking status: Passive Smoke Exposure - Never Smoker  . Smokeless tobacco: Never Used  . Alcohol Use: No    Allergies: No Known Allergies  Prescriptions prior to admission  Medication Sig Dispense Refill  . Prenatal Vit-Fe Fumarate-FA (PRENATAL MULTIVITAMIN) TABS Take 1 tablet by mouth daily.      . promethazine (PHENERGAN) 25 MG tablet Take 1 tablet (25 mg total) by mouth every 6 (six) hours as needed for nausea.  30 tablet  0    Review of Systems  Gastrointestinal: Positive for abdominal pain.   Pertinent positives and negatives discussed in HPI.  Physical Exam   Blood pressure 109/64, pulse 101, temperature  98.5 F (36.9 C), temperature source Oral, resp. rate 16, last menstrual period 11/08/2012, SpO2 100.00%.  Physical Exam  Constitutional: She appears well-developed and well-nourished. No distress.  HENT:  Head: Normocephalic.  Respiratory: Effort normal.  GI: Soft. She exhibits no distension. Tenderness: mild, LLQ. There is no rebound and no guarding.  Positive fetal heart tones, midline, 160 bpm  Genitourinary: Cervix exhibits no motion tenderness, no discharge and no friability. Right adnexum displays no mass, no tenderness and no fullness. Left adnexum displays tenderness. Left adnexum displays no mass and no fullness. Vaginal discharge (thin, white, bubbly) found.  Musculoskeletal:  Voiced back pain when laying her flat for pelvic exam   Results for orders placed during the hospital encounter of 01/28/13 (from the past 24 hour(s))  WET PREP, GENITAL     Status: Abnormal   Collection Time    01/28/13 11:10 AM      Result Value Range   Yeast Wet Prep HPF POC NONE SEEN  NONE SEEN   Trich, Wet Prep NONE SEEN  NONE SEEN   Clue Cells Wet Prep HPF POC FEW (*) NONE SEEN   WBC, Wet Prep HPF POC MODERATE (*) NONE SEEN    MAU Course  Procedures  MDM Based on vaginal discharge and cervical tenderness as well as lab finding of clue cells, going to treat today  for BV and presumptively for GC/Chlamydia. Ectopic seems unlikely due to mild nature of symptoms and pt's lack of distress. Back pain seems musculoskeletal in nature due to tenderness and pain with lying flat as well as considering job requiring lifting.  Assessment and Plan  Bacterial vaginosis - wet prep shows moderate clue cells. Rx Metronidazole 500 mg po BID x 7 days - GC/Chlamydia pending. Rx azithromycin 1g PO x 1 - U/A from 3/26 normal - Tylenol for pain  Back pain, musculoskeletal - Recommend support belt while working  - Tylenol for pain  Lorna Dibble, PA-S 01/28/2013, 10:36 AM   22 y.o. female @ [redacted]w[redacted]d  gestation with c/o watery green vaginal discharge with odor. Clinical findings concerning for trichomonas but Wet prep does not show trich. Will treat for BV. Cultures pending. Will also treat back pain with Flexeril and pregnancy girdle.  I have examined this patient and assisted the student with his assessment and plan of care. Patent is stable for discharge home to follow up for prenatal care.

## 2013-02-23 ENCOUNTER — Encounter: Payer: Medicaid Other | Admitting: Obstetrics & Gynecology

## 2013-02-24 ENCOUNTER — Ambulatory Visit (INDEPENDENT_AMBULATORY_CARE_PROVIDER_SITE_OTHER): Payer: Medicaid Other | Admitting: Obstetrics and Gynecology

## 2013-02-24 ENCOUNTER — Encounter: Payer: Self-pay | Admitting: Obstetrics and Gynecology

## 2013-02-24 VITALS — BP 108/68 | Temp 97.7°F | Wt 125.6 lb

## 2013-02-24 DIAGNOSIS — R87612 Low grade squamous intraepithelial lesion on cytologic smear of cervix (LGSIL): Secondary | ICD-10-CM

## 2013-02-24 DIAGNOSIS — Z3480 Encounter for supervision of other normal pregnancy, unspecified trimester: Secondary | ICD-10-CM

## 2013-02-24 DIAGNOSIS — Z3491 Encounter for supervision of normal pregnancy, unspecified, first trimester: Secondary | ICD-10-CM

## 2013-02-24 LAB — POCT URINALYSIS DIP (DEVICE)
Bilirubin Urine: NEGATIVE
Glucose, UA: NEGATIVE mg/dL
Nitrite: NEGATIVE
Specific Gravity, Urine: 1.02 (ref 1.005–1.030)

## 2013-02-24 MED ORDER — PRENATAL MULTIVITAMIN CH: 1.0000 | ORAL_TABLET | Freq: Every day | ORAL | Status: AC

## 2013-02-24 NOTE — Progress Notes (Signed)
   Subjective:    Lauren Booker is a G2P1001 [redacted]w[redacted]d being seen today for her first obstetrical visit.  Her obstetrical history is significant for none. Pregnancy history fully reviewed.  Patient reports positive movement. Also complains of sore throat, congestion, and runny nose for the past week.Ceasar Mons Vitals:   02/24/13 1031  BP: 108/68  Temp: 97.7 F (36.5 C)  Weight: 125 lb 9.6 oz (56.972 kg)    HISTORY: OB History   Grav Para Term Preterm Abortions TAB SAB Ect Mult Living   2 1 1  0 0 0 0 0 0 1     # Outc Date GA Lbr Len/2nd Wgt Sex Del Anes PTL Lv   1 TRM 5/13 [redacted]w[redacted]d 08:52 / 04:53 6lb14.6oz(3.135kg) F SVD EPI  Yes   Comments: WNL   2 CUR              Past Medical History  Diagnosis Date  . Asthma   . Herpes simplex     last outbreak 2011   Past Surgical History  Procedure Laterality Date  . No past surgeries     Family History  Problem Relation Age of Onset  . Hypertension Maternal Grandmother   . Diabetes Maternal Grandmother   . Hypertension Paternal Grandmother   . Diabetes Paternal Grandmother   . Anesthesia problems Neg Hx      Exam    Uterus:     Pelvic Exam:    Perineum: Normal Perineum   Vulva: normal   Vagina:  normal mucosa, normal discharge   pH:    Cervix: no lesions   Adnexa: normal adnexa   Bony Pelvis: average  System: Breast:  not examined   Skin: normal coloration and turgor, no rashes    Neurologic: oriented, normal   Extremities: no deformities   HEENT extra ocular movement intact, sclera clear, anicteric and oropharynyx with erythema, nasal turbinates with erythema   Mouth/Teeth mucous membranes moist, pharynx normal without lesions   Neck supple   Cardiovascular: regular rate and rhythm   Respiratory:  appears well, vitals normal, no respiratory distress, acyanotic, normal RR, neck free of mass or lymphadenopathy, chest clear, no wheezing, crepitations, rhonchi, normal symmetric air entry   Abdomen: soft, non-tender;  bowel sounds normal; no masses,  no organomegaly   Urinary: bladder not palpable  Heart tones 150    Assessment:    Pregnancy: G2P1001 at 15.3 weeks. Patient Active Problem List  Diagnosis  . Anemia  . HSV-2 (herpes simplex virus 2) infection  . Fetal anomaly  . Asthma  . Beta hemolytic streptococcus urinary tract infection complicating pregnancy  . Vaginal delivery  . Postpartum anemia    Patient with history of HSV2-will need to monitor and determine need for prophylactic treatment with acyclovir closer to time of delivery.    Plan:     Initial labs drawn. PAP and GC/Chlam done.  Prenatal vitamins. Problem list reviewed and updated. Genetic Screening discussed Quad Screen: requested.  Ultrasound discussed; fetal survey: requested and being arranged.  Follow up in 4 weeks. 50% of 30 min visit spent on counseling and coordination of care.  Patient additionally with symptoms of allergic rhinitis and advised her to take zyrtec OTC.   Marikay Alar 02/24/2013

## 2013-02-24 NOTE — Patient Instructions (Signed)
Pregnancy - Second Trimester The second trimester of pregnancy (3 to 6 months) is a period of rapid growth for you and your baby. At the end of the sixth month, your baby is about 9 inches long and weighs 1 1/2 pounds. You will begin to feel the baby move between 18 and 20 weeks of the pregnancy. This is called quickening. Weight gain is faster. A clear fluid (colostrum) may leak out of your breasts. You may feel small contractions of the womb (uterus). This is known as false labor or Braxton-Hicks contractions. This is like a practice for labor when the baby is ready to be born. Usually, the problems with morning sickness have usually passed by the end of your first trimester. Some women develop small dark blotches (called cholasma, mask of pregnancy) on their face that usually goes away after the baby is born. Exposure to the sun makes the blotches worse. Acne may also develop in some pregnant women and pregnant women who have acne, may find that it goes away. PRENATAL EXAMS  Blood work may continue to be done during prenatal exams. These tests are done to check on your health and the probable health of your baby. Blood work is used to follow your blood levels (hemoglobin). Anemia (low hemoglobin) is common during pregnancy. Iron and vitamins are given to help prevent this. You will also be checked for diabetes between 24 and 28 weeks of the pregnancy. Some of the previous blood tests may be repeated.  The size of the uterus is measured during each visit. This is to make sure that the baby is continuing to grow properly according to the dates of the pregnancy.  Your blood pressure is checked every prenatal visit. This is to make sure you are not getting toxemia.  Your urine is checked to make sure you do not have an infection, diabetes or protein in the urine.  Your weight is checked often to make sure gains are happening at the suggested rate. This is to ensure that both you and your baby are growing  normally.  Sometimes, an ultrasound is performed to confirm the proper growth and development of the baby. This is a test which bounces harmless sound waves off the baby so your caregiver can more accurately determine due dates. Sometimes, a specialized test is done on the amniotic fluid surrounding the baby. This test is called an amniocentesis. The amniotic fluid is obtained by sticking a needle into the belly (abdomen). This is done to check the chromosomes in instances where there is a concern about possible genetic problems with the baby. It is also sometimes done near the end of pregnancy if an early delivery is required. In this case, it is done to help make sure the baby's lungs are mature enough for the baby to live outside of the womb. CHANGES OCCURING IN THE SECOND TRIMESTER OF PREGNANCY Your body goes through many changes during pregnancy. They vary from person to person. Talk to your caregiver about changes you notice that you are concerned about.  During the second trimester, you will likely have an increase in your appetite. It is normal to have cravings for certain foods. This varies from person to person and pregnancy to pregnancy.  Your lower abdomen will begin to bulge.  You may have to urinate more often because the uterus and baby are pressing on your bladder. It is also common to get more bladder infections during pregnancy (pain with urination). You can help this by   drinking lots of fluids and emptying your bladder before and after intercourse.  You may begin to get stretch marks on your hips, abdomen, and breasts. These are normal changes in the body during pregnancy. There are no exercises or medications to take that prevent this change.  You may begin to develop swollen and bulging veins (varicose veins) in your legs. Wearing support hose, elevating your feet for 15 minutes, 3 to 4 times a day and limiting salt in your diet helps lessen the problem.  Heartburn may develop  as the uterus grows and pushes up against the stomach. Antacids recommended by your caregiver helps with this problem. Also, eating smaller meals 4 to 5 times a day helps.  Constipation can be treated with a stool softener or adding bulk to your diet. Drinking lots of fluids, vegetables, fruits, and whole grains are helpful.  Exercising is also helpful. If you have been very active up until your pregnancy, most of these activities can be continued during your pregnancy. If you have been less active, it is helpful to start an exercise program such as walking.  Hemorrhoids (varicose veins in the rectum) may develop at the end of the second trimester. Warm sitz baths and hemorrhoid cream recommended by your caregiver helps hemorrhoid problems.  Backaches may develop during this time of your pregnancy. Avoid heavy lifting, wear low heal shoes and practice good posture to help with backache problems.  Some pregnant women develop tingling and numbness of their hand and fingers because of swelling and tightening of ligaments in the wrist (carpel tunnel syndrome). This goes away after the baby is born.  As your breasts enlarge, you may have to get a bigger bra. Get a comfortable, cotton, support bra. Do not get a nursing bra until the last month of the pregnancy if you will be nursing the baby.  You may get a dark line from your belly button to the pubic area called the linea nigra.  You may develop rosy cheeks because of increase blood flow to the face.  You may develop spider looking lines of the face, neck, arms and chest. These go away after the baby is born. HOME CARE INSTRUCTIONS   It is extremely important to avoid all smoking, herbs, alcohol, and unprescribed drugs during your pregnancy. These chemicals affect the formation and growth of the baby. Avoid these chemicals throughout the pregnancy to ensure the delivery of a healthy infant.  Most of your home care instructions are the same as  suggested for the first trimester of your pregnancy. Keep your caregiver's appointments. Follow your caregiver's instructions regarding medication use, exercise and diet.  During pregnancy, you are providing food for you and your baby. Continue to eat regular, well-balanced meals. Choose foods such as meat, fish, milk and other low fat dairy products, vegetables, fruits, and whole-grain breads and cereals. Your caregiver will tell you of the ideal weight gain.  A physical sexual relationship may be continued up until near the end of pregnancy if there are no other problems. Problems could include early (premature) leaking of amniotic fluid from the membranes, vaginal bleeding, abdominal pain, or other medical or pregnancy problems.  Exercise regularly if there are no restrictions. Check with your caregiver if you are unsure of the safety of some of your exercises. The greatest weight gain will occur in the last 2 trimesters of pregnancy. Exercise will help you:  Control your weight.  Get you in shape for labor and delivery.  Lose weight   after you have the baby.  Wear a good support or jogging bra for breast tenderness during pregnancy. This may help if worn during sleep. Pads or tissues may be used in the bra if you are leaking colostrum.  Do not use hot tubs, steam rooms or saunas throughout the pregnancy.  Wear your seat belt at all times when driving. This protects you and your baby if you are in an accident.  Avoid raw meat, uncooked cheese, cat litter boxes and soil used by cats. These carry germs that can cause birth defects in the baby.  The second trimester is also a good time to visit your dentist for your dental health if this has not been done yet. Getting your teeth cleaned is OK. Use a soft toothbrush. Brush gently during pregnancy.  It is easier to loose urine during pregnancy. Tightening up and strengthening the pelvic muscles will help with this problem. Practice stopping your  urination while you are going to the bathroom. These are the same muscles you need to strengthen. It is also the muscles you would use as if you were trying to stop from passing gas. You can practice tightening these muscles up 10 times a set and repeating this about 3 times per day. Once you know what muscles to tighten up, do not perform these exercises during urination. It is more likely to contribute to an infection by backing up the urine.  Ask for help if you have financial, counseling or nutritional needs during pregnancy. Your caregiver will be able to offer counseling for these needs as well as refer you for other special needs.  Your skin may become oily. If so, wash your face with mild soap, use non-greasy moisturizer and oil or cream based makeup. MEDICATIONS AND DRUG USE IN PREGNANCY  Take prenatal vitamins as directed. The vitamin should contain 1 milligram of folic acid. Keep all vitamins out of reach of children. Only a couple vitamins or tablets containing iron may be fatal to a baby or young child when ingested.  Avoid use of all medications, including herbs, over-the-counter medications, not prescribed or suggested by your caregiver. Only take over-the-counter or prescription medicines for pain, discomfort, or fever as directed by your caregiver. Do not use aspirin.  Let your caregiver also know about herbs you may be using.  Alcohol is related to a number of birth defects. This includes fetal alcohol syndrome. All alcohol, in any form, should be avoided completely. Smoking will cause low birth rate and premature babies.  Street or illegal drugs are very harmful to the baby. They are absolutely forbidden. A baby born to an addicted mother will be addicted at birth. The baby will go through the same withdrawal an adult does. SEEK MEDICAL CARE IF:  You have any concerns or worries during your pregnancy. It is better to call with your questions if you feel they cannot wait, rather  than worry about them. SEEK IMMEDIATE MEDICAL CARE IF:   An unexplained oral temperature above 102 F (38.9 C) develops, or as your caregiver suggests.  You have leaking of fluid from the vagina (birth canal). If leaking membranes are suspected, take your temperature and tell your caregiver of this when you call.  There is vaginal spotting, bleeding, or passing clots. Tell your caregiver of the amount and how many pads are used. Light spotting in pregnancy is common, especially following intercourse.  You develop a bad smelling vaginal discharge with a change in the color from clear   to white.  You continue to feel sick to your stomach (nauseated) and have no relief from remedies suggested. You vomit blood or coffee ground-like materials.  You lose more than 2 pounds of weight or gain more than 2 pounds of weight over 1 week, or as suggested by your caregiver.  You notice swelling of your face, hands, feet, or legs.  You get exposed to German measles and have never had them.  You are exposed to fifth disease or chickenpox.  You develop belly (abdominal) pain. Round ligament discomfort is a common non-cancerous (benign) cause of abdominal pain in pregnancy. Your caregiver still must evaluate you.  You develop a bad headache that does not go away.  You develop fever, diarrhea, pain with urination, or shortness of breath.  You develop visual problems, blurry, or double vision.  You fall or are in a car accident or any kind of trauma.  There is mental or physical violence at home. Document Released: 10/15/2001 Document Revised: 01/13/2012 Document Reviewed: 04/19/2009 [employer] Patient Information 2013 [employer].  

## 2013-02-24 NOTE — Progress Notes (Signed)
Pt complaining of sore throat.  She has a sick child at home.  Patient believes she had thrush and tried apple cider vinegar home remedy to clear it up and it worked.  Hurts to swallow.

## 2013-02-25 LAB — OBSTETRIC PANEL
Eosinophils Absolute: 0.1 10*3/uL (ref 0.0–0.7)
Hemoglobin: 10.8 g/dL — ABNORMAL LOW (ref 12.0–15.0)
Hepatitis B Surface Ag: NEGATIVE
Lymphs Abs: 1.3 10*3/uL (ref 0.7–4.0)
MCH: 29.7 pg (ref 26.0–34.0)
MCV: 89.8 fL (ref 78.0–100.0)
Monocytes Relative: 7 % (ref 3–12)
Neutrophils Relative %: 78 % — ABNORMAL HIGH (ref 43–77)
RBC: 3.64 MIL/uL — ABNORMAL LOW (ref 3.87–5.11)
Rh Type: POSITIVE
Rubella: 5.79 Index — ABNORMAL HIGH (ref <0.90)

## 2013-02-25 NOTE — Progress Notes (Signed)
Evaluation and management procedures were performed by Resident physician under my supervision/collaboration. Chart reviewed, patient examined by me and I agree with management and plan.  

## 2013-02-26 DIAGNOSIS — R87612 Low grade squamous intraepithelial lesion on cytologic smear of cervix (LGSIL): Secondary | ICD-10-CM | POA: Insufficient documentation

## 2013-02-26 LAB — HEMOGLOBINOPATHY EVALUATION
Hemoglobin Other: 0 %
Hgb A: 97.6 % (ref 96.8–97.8)

## 2013-03-08 ENCOUNTER — Telehealth: Payer: Self-pay

## 2013-03-08 ENCOUNTER — Encounter: Payer: Self-pay | Admitting: *Deleted

## 2013-03-08 NOTE — Telephone Encounter (Signed)
Called pt and unable to leave message due to VM box stating "the person that you are calling is not available please try your call again".

## 2013-03-08 NOTE — Telephone Encounter (Signed)
Message copied by Faythe Casa on Mon Mar 08, 2013  4:44 PM ------      Message from: Odelia Gage A      Created: Fri Mar 05, 2013 12:35 PM       Appointment is 05/23 @10 :30                  ----- Message -----         From: Drucilla Schmidt Day, RN         Sent: 03/02/2013   3:25 PM           To: [facility] Admin Pool            Return to clinical pool after appt made- we will contact pt.  Thanks!       ----- Message -----         From: Danae Orleans, CNM         Sent: 02/26/2013  10:46 PM           To: [facility] Clinical Pool            Schedule colpo please             ------

## 2013-03-09 NOTE — Telephone Encounter (Signed)
Called pt and call went straight to voicemail with the message as below.  Will send patient a letter letting her know about her upcoming colposcopy.

## 2013-03-13 ENCOUNTER — Inpatient Hospital Stay (HOSPITAL_COMMUNITY)
Admission: AD | Admit: 2013-03-13 | Discharge: 2013-03-13 | Disposition: A | Discharge status: Home / Self Care | Payer: Medicaid Other | Source: Ambulatory Visit | Attending: Obstetrics & Gynecology | Admitting: Obstetrics & Gynecology

## 2013-03-13 ENCOUNTER — Encounter (HOSPITAL_COMMUNITY): Payer: Self-pay | Admitting: *Deleted

## 2013-03-13 DIAGNOSIS — R197 Diarrhea, unspecified: Secondary | ICD-10-CM | POA: Insufficient documentation

## 2013-03-13 DIAGNOSIS — O99891 Other specified diseases and conditions complicating pregnancy: Secondary | ICD-10-CM | POA: Insufficient documentation

## 2013-03-13 DIAGNOSIS — R109 Unspecified abdominal pain: Secondary | ICD-10-CM | POA: Insufficient documentation

## 2013-03-13 DIAGNOSIS — K529 Noninfective gastroenteritis and colitis, unspecified: Secondary | ICD-10-CM

## 2013-03-13 DIAGNOSIS — M549 Dorsalgia, unspecified: Secondary | ICD-10-CM | POA: Insufficient documentation

## 2013-03-13 DIAGNOSIS — K5289 Other specified noninfective gastroenteritis and colitis: Secondary | ICD-10-CM

## 2013-03-13 LAB — URINE MICROSCOPIC-ADD ON

## 2013-03-13 LAB — URINALYSIS, ROUTINE W REFLEX MICROSCOPIC
Nitrite: NEGATIVE
Specific Gravity, Urine: 1.02 (ref 1.005–1.030)
pH: 7.5 (ref 5.0–8.0)

## 2013-03-13 MED ORDER — ONDANSETRON HCL 4 MG PO TABS: 4.0000 mg | ORAL_TABLET | Freq: Three times a day (TID) | ORAL | Status: DC | PRN

## 2013-03-13 MED ORDER — LOPERAMIDE HCL 2 MG PO CAPS
4.0000 mg | ORAL_CAPSULE | Freq: Once | ORAL | Status: AC
  Administered 2013-03-13: 4 mg via ORAL
  Filled 2013-03-13: qty 2

## 2013-03-13 MED ORDER — ONDANSETRON 8 MG PO TBDP
8.0000 mg | ORAL_TABLET | Freq: Once | ORAL | Status: AC
  Administered 2013-03-13: 8 mg via ORAL
  Filled 2013-03-13: qty 1

## 2013-03-13 MED ORDER — LOPERAMIDE HCL 2 MG PO TABS: 2.0000 mg | ORAL_TABLET | Freq: Four times a day (QID) | ORAL | Status: DC | PRN

## 2013-03-13 NOTE — MAU Note (Signed)
Pt reports diarrhea vomitting since yesterday. Pt reports stomach and back pain 10/10.

## 2013-03-13 NOTE — MAU Provider Note (Signed)
Chief Complaint: No chief complaint on file.  First Provider Initiated Contact with Patient 03/13/13 [phone]     SUBJECTIVE HPI: Lauren Booker is a 22 y.o. G2P1001 at [redacted]w[redacted]d by LMP who presents with vomiting and diarrhea 6 x each since last night. Has not tried anything for her Sx. Has had N/V of pregnancy first trimester, but it had stopped. Feels like this episode is something different. Also reports diffuse mid-abd pain since last night and low back pain intermittently this pregnancy. Denies fever, chills, sick contacts, flank pain, dysuria, hematuria, frequency, VB, vaginal discharge. Declines GC/CT, Wet prep.     Past Medical History  Diagnosis Date  . Asthma   . Herpes simplex     last outbreak 2011   OB History   Grav Para Term Preterm Abortions TAB SAB Ect Mult Living   2 1 1  0 0 0 0 0 0 1     # Outc Date GA Lbr Len/2nd Wgt Sex Del Anes PTL Lv   1 TRM 5/13 [redacted]w[redacted]d 08:52 / 04:53 3.135kg(6lb14.6oz) F SVD EPI  Yes   Comments: WNL   2 CUR              Past Surgical History  Procedure Laterality Date  . No past surgeries     History   Social History  . Marital Status: Single    Spouse Name: N/A    Number of Children: N/A  . Years of Education: N/A   Occupational History  . Not on file.   Social History Main Topics  . Smoking status: Never Smoker   . Smokeless tobacco: Never Used  . Alcohol Use: No  . Drug Use: No  . Sexually Active: Yes    Birth Control/ Protection: None   Other Topics Concern  . Not on file   Social History Narrative  . No narrative on file   No current facility-administered medications on file prior to encounter.   Current Outpatient Prescriptions on File Prior to Encounter  Medication Sig Dispense Refill  . cyclobenzaprine (FLEXERIL) 5 MG tablet Take 1 tablet (5 mg total) by mouth 3 (three) times daily as needed for muscle spasms.  20 tablet  0  . Prenatal Vit-Fe Fumarate-FA (PRENATAL MULTIVITAMIN) TABS Take 1 tablet by mouth daily.  90  tablet  3  . promethazine (PHENERGAN) 25 MG tablet Take 1 tablet (25 mg total) by mouth every 6 (six) hours as needed for nausea.  30 tablet  0   No Known Allergies  ROS: Pertinent items in HPI  OBJECTIVE Blood pressure 103/67, pulse 97, temperature 98.7 F (37.1 C), temperature source Oral, resp. rate 18, height 5\' 3"  (1.6 m), weight 57.153 kg (126 lb), last menstrual period 11/08/2012. GENERAL: Well-developed, well-nourished female in no acute distress.  HEENT: Normocephalic. Mucus membranes moist. HEART: normal rate RESP: normal effort ABDOMEN: Soft, mild diffuse mid and upper abd tenderness. Pos BS x 4. Neg Murphy's sign. Gravid, S=D.  BACK: No CVAT. EXTREMITIES: Nontender, no edema NEURO: Alert and oriented SPECULUM EXAM: Declined. FHR 165 by doppler.  LAB RESULTS Results for orders placed during the hospital encounter of 03/13/13 (from the past 24 hour(s))  URINALYSIS, ROUTINE W REFLEX MICROSCOPIC     Status: Abnormal   Collection Time    03/13/13  4:30 AM      Result Value Range   Color, Urine YELLOW  YELLOW   APPearance CLEAR  CLEAR   Specific Gravity, Urine 1.020  1.005 - 1.030  pH 7.5  5.0 - 8.0   Glucose, UA NEGATIVE  NEGATIVE mg/dL   Hgb urine dipstick NEGATIVE  NEGATIVE   Bilirubin Urine NEGATIVE  NEGATIVE   Ketones, ur NEGATIVE  NEGATIVE mg/dL   Protein, ur NEGATIVE  NEGATIVE mg/dL   Urobilinogen, UA 0.2  0.0 - 1.0 mg/dL   Nitrite NEGATIVE  NEGATIVE   Leukocytes, UA SMALL (*) NEGATIVE  URINE MICROSCOPIC-ADD ON     Status: Abnormal   Collection Time    03/13/13  4:30 AM      Result Value Range   Squamous Epithelial / LPF FEW (*) RARE   WBC, UA 0-2  <3 WBC/hpf   RBC / HPF 0-2  <3 RBC/hpf   Urine-Other MUCOUS PRESENT      IMAGING No results found.  MAU COURSE No Vomiting or diarrhea in MAU. Nausea and abd cramping resolved w/ Zofran and Imodium respectively. Tolerating PO's.  ASSESSMENT 1. Gastroenteritis, acute    PLAN Discharge home      Follow-up Information   Follow up with [facility]. (as scheduled)    Contact information:   [address] [place] [address] [phone]      Follow up with [facility] [place] [facility]. (As needed in emergencies)    Contact information:   [address] [place] [address] [phone]       Medication List    STOP taking these medications       metroNIDAZOLE 500 MG tablet  Commonly known as:  FLAGYL      TAKE these medications       albuterol 108 (90 BASE) MCG/ACT inhaler  Commonly known as:  PROVENTIL HFA;VENTOLIN HFA  Inhale 2 puffs into the lungs every 6 (six) hours as needed for wheezing.     cyclobenzaprine 5 MG tablet  Commonly known as:  FLEXERIL  Take 1 tablet (5 mg total) by mouth 3 (three) times daily as needed for muscle spasms.     loperamide 2 MG tablet  Commonly known as:  IMODIUM A-D  Take 1 tablet (2 mg total) by mouth 4 (four) times daily as needed for diarrhea or loose stools.     ondansetron 4 MG tablet  Commonly known as:  ZOFRAN  Take 1 tablet (4 mg total) by mouth every 8 (eight) hours as needed for nausea.     prenatal multivitamin Tabs  Take 1 tablet by mouth daily.     promethazine 25 MG tablet  Commonly known as:  PHENERGAN  Take 1 tablet (25 mg total) by mouth every 6 (six) hours as needed for nausea.        [place], [place] 03/13/2013  6:19 AM

## 2013-03-16 ENCOUNTER — Ambulatory Visit (HOSPITAL_COMMUNITY)
Admission: RE | Admit: 2013-03-16 | Discharge: 2013-03-16 | Disposition: A | Discharge status: Home / Self Care | Payer: Medicaid Other | Source: Ambulatory Visit | Attending: Obstetrics and Gynecology | Admitting: Obstetrics and Gynecology

## 2013-03-16 DIAGNOSIS — Z3491 Encounter for supervision of normal pregnancy, unspecified, first trimester: Secondary | ICD-10-CM

## 2013-03-16 DIAGNOSIS — Z3689 Encounter for other specified antenatal screening: Secondary | ICD-10-CM | POA: Insufficient documentation

## 2013-03-22 NOTE — MAU Provider Note (Signed)
Attestation of Attending Supervision of Advanced Practitioner (CNM/NP): Evaluation and management procedures were performed by the Advanced Practitioner under my supervision and collaboration. I have reviewed the Advanced Practitioner's note and chart, and I agree with the management and plan.  Bj Morlock H. 4:15 PM   

## 2013-03-24 ENCOUNTER — Encounter: Payer: Self-pay | Admitting: Advanced Practice Midwife

## 2013-03-26 ENCOUNTER — Encounter: Payer: Self-pay | Admitting: Obstetrics & Gynecology

## 2013-03-26 ENCOUNTER — Other Ambulatory Visit (HOSPITAL_COMMUNITY)
Admission: RE | Admit: 2013-03-26 | Discharge: 2013-03-26 | Disposition: A | Discharge status: Home / Self Care | Payer: Medicaid Other | Source: Ambulatory Visit | Attending: Obstetrics & Gynecology | Admitting: Obstetrics & Gynecology

## 2013-03-26 ENCOUNTER — Ambulatory Visit (INDEPENDENT_AMBULATORY_CARE_PROVIDER_SITE_OTHER): Payer: Medicaid Other | Admitting: Obstetrics & Gynecology

## 2013-03-26 VITALS — BP 98/63 | HR 99 | Temp 97.3°F | Ht 64.0 in | Wt 128.0 lb

## 2013-03-26 DIAGNOSIS — N870 Mild cervical dysplasia: Secondary | ICD-10-CM | POA: Insufficient documentation

## 2013-03-26 DIAGNOSIS — R87612 Low grade squamous intraepithelial lesion on cytologic smear of cervix (LGSIL): Secondary | ICD-10-CM

## 2013-03-26 NOTE — Addendum Note (Signed)
Addended by: Willodean Rosenthal on: 03/26/2013 11:31 AM   Modules accepted: Orders

## 2013-03-26 NOTE — Patient Instructions (Addendum)
Abnormal Pap Test Information During a Pap test, the cells on the surface of your cervix are checked to see if they look normal, abnormal, or if they show signs of having been altered by a certain type of virus called human papillomavirus, or HPV. Cervical cells that have been affected by HPV are called dysplasia. Dysplasia is not cancer, but describes abnormal cells found on the surface of the cervix. Depending on the degree of dysplasia, some of the cells may be considered pre-cancerous and may turn into cancer over time if follow up with a caregiver is delayed.  WHAT DOES AN ABNORMAL PAP TEST MEAN? Having an abnormal pap test does not mean that you have cancer. However, certain types of abnormal pap tests can be a sign that a person is at a higher risk of developing cancer. Your caregiver will want to do other tests to find out more about the abnormal cells. Your abnormal Pap test results could show:   Small and uncertain changes that should be carefully watched.   Cervical dysplasia that has caused mild changes and can be followed over time.  Cervical dysplasia that is more severe and needs to be followed and treated to ensure the problem goes away.  Cancer.  When severe cervical dysplasia is found and treated early, it rarely will grow into cancer.  WHAT WILL BE DONE ABOUT MY ABNORMAL PAP TEST?  A colposcopy may be needed. This is a procedure where your cervix is examined using light and magnification.  A small tissue sample of your cervix (biopsy) may need to be removed and then examined. This is often performed if there are areas that appear infected.  A sample of cells from the cervical canal may be removed with either a small brush or scraping instrument (curette). Based on the results of the procedures above, some caregivers may recommend either cryotherapy of the cervix or a surgical LEEP where a portion of the cervix is removed. LEEP is short for "loop electrical excisional  procedure." Rarely, a caregiver may recommend a cone biopsy.This is a procedure where a small, cone-shaped sample of your cervix is taken out. The part that is taken out is the area where the abnormal cells are.  WHAT IF I HAVE A DYSPLASIA OR A CANCER? You may be referred to a specialist. Radiation may also be a treatment for more advanced cancer. Having a hysterectomy is the last treatment option for dysplasia, but it is a more common treatment for someone with cancer. All treatment options will be discussed with you by your caregiver. WHAT SHOULD YOU DO AFTER BEING TREATED? If you have had an abnormal pap test, you should continue to have regular pap tests and check-ups as directed by your caregiver. Your cervical problem will be carefully watched so it does not get worse. Also, your caregiver can watch for, and treat, any new problems that may come up. Document Released: 02/05/2011 Document Revised: 01/13/2012 Document Reviewed: 10/17/2011 [facility] Patient Information 2014 [place], [place]. Colposcopy Care After Colposcopy is a procedure in which a special tool is used to magnify the surface of the cervix. A tissue sample (biopsy) may also be taken. This sample will be looked at for cervical cancer or other problems. After the test:  You may have some cramping.  Lie down for a few minutes if you feel lightheaded.   You may have some bleeding which should stop in a few days. HOME CARE  Do not have sex or use tampons for 2  to 3 days or as told.  Only take medicine as told by your doctor.  Continue to take your birth control pills as usual. Finding out the results of your test Ask when your test results will be ready. Make sure you get your test results. GET HELP RIGHT AWAY IF:  You are bleeding a lot or are passing blood clots.  You develop a fever of 102 F (38.9 C) or higher.  You have abnormal vaginal discharge.  You have cramps that do not go away with medicine.  You feel  lightheaded, dizzy, or pass out (faint). MAKE SURE YOU:   Understand these instructions.  Will watch your condition.  Will get help right away if you are not doing well or get worse. Document Released: 04/08/2008 Document Revised: 01/13/2012 Document Reviewed: 04/08/2008 [facility] Patient Information 2014 [place], [place].

## 2013-03-26 NOTE — Progress Notes (Signed)
Patient ID: Lauren Booker, female   DOB: 12-02-1990, 22 y.o.   MRN: [id number] Patient given informed consent, signed copy in the chart, time out was performed.  Placed in lithotomy position. Cervix viewed with speculum and colposcope after application of acetic acid.  02/24/2013 Diagnosis LOW GRADE SQUAMOUS INTRAEPITHELIAL LESION: CIN-1/ HPV (LSIL); HOWEVER, THERE IS A RARE CELL WHERE A HIGHER GRADE LESION CANNOT BE COMPLETELY EXCLUDED. Colposcopy adequate?  yes Acetowhite lesions?yes Punctation?no Mosaicism?  no Abnormal vasculature?  no Biopsies?yes x1 at 6:00 ECC?no  Patient was given post procedure instructions.  She will return in 2 weeks for results and OB visit.  NOTE: pt did not get an OB visit on tues as previously scheduled.  She was offered one today but, refused to answer questions after her colpo. Pt instructed to return for her OB visit.  Kazuko Clemence L. Harraway-Smith, M.D., [place]

## 2013-03-31 ENCOUNTER — Telehealth: Payer: Self-pay | Admitting: General Practice

## 2013-03-31 ENCOUNTER — Encounter: Payer: Self-pay | Admitting: Obstetrics & Gynecology

## 2013-03-31 NOTE — Telephone Encounter (Signed)
Message copied by Kathee Delton on Wed Mar 31, 2013  4:44 PM ------      Message from: Lauren Booker      Created: Wed Mar 31, 2013  3:23 PM       Please call pt.  S/p colpo.  Bx: low grade dysplasia.  Needs co-testing in 1 year.            clh-S ------

## 2013-03-31 NOTE — Telephone Encounter (Signed)
Called patient, no answer- left message to call us back at the clinics 

## 2013-04-01 NOTE — Telephone Encounter (Signed)
Called patient and informed/explained to her the results and recommendations. Patient verbalized understanding and had no further questions

## 2013-04-07 ENCOUNTER — Ambulatory Visit (INDEPENDENT_AMBULATORY_CARE_PROVIDER_SITE_OTHER): Payer: Medicaid Other | Admitting: Advanced Practice Midwife

## 2013-04-07 VITALS — BP 103/67 | Temp 97.1°F | Wt 131.4 lb

## 2013-04-07 DIAGNOSIS — O98519 Other viral diseases complicating pregnancy, unspecified trimester: Secondary | ICD-10-CM

## 2013-04-07 DIAGNOSIS — D649 Anemia, unspecified: Secondary | ICD-10-CM

## 2013-04-07 LAB — POCT URINALYSIS DIP (DEVICE)
Nitrite: NEGATIVE
Protein, ur: NEGATIVE mg/dL
Urobilinogen, UA: 0.2 mg/dL (ref 0.0–1.0)
pH: 6.5 (ref 5.0–8.0)

## 2013-04-07 NOTE — Progress Notes (Signed)
No questions, feeling well.

## 2013-04-07 NOTE — Progress Notes (Signed)
Pulse: 86

## 2013-05-05 ENCOUNTER — Encounter: Payer: Self-pay | Admitting: Family

## 2013-05-18 ENCOUNTER — Encounter: Payer: Self-pay | Admitting: Family Medicine

## 2013-06-05 ENCOUNTER — Inpatient Hospital Stay (HOSPITAL_COMMUNITY)
Admission: AD | Admit: 2013-06-05 | Discharge: 2013-06-08 | DRG: 778 | Disposition: A | Discharge status: Home / Self Care | Payer: Medicaid Other | Source: Ambulatory Visit | Attending: Obstetrics and Gynecology | Admitting: Obstetrics and Gynecology

## 2013-06-05 ENCOUNTER — Encounter (HOSPITAL_COMMUNITY): Payer: Self-pay | Admitting: Obstetrics and Gynecology

## 2013-06-05 ENCOUNTER — Inpatient Hospital Stay (HOSPITAL_COMMUNITY): Payer: Medicaid Other

## 2013-06-05 DIAGNOSIS — O26873 Cervical shortening, third trimester: Secondary | ICD-10-CM | POA: Diagnosis present

## 2013-06-05 DIAGNOSIS — O26879 Cervical shortening, unspecified trimester: Secondary | ICD-10-CM | POA: Diagnosis present

## 2013-06-05 DIAGNOSIS — W010XXA Fall on same level from slipping, tripping and stumbling without subsequent striking against object, initial encounter: Secondary | ICD-10-CM | POA: Diagnosis present

## 2013-06-05 DIAGNOSIS — O368390 Maternal care for abnormalities of the fetal heart rate or rhythm, unspecified trimester, not applicable or unspecified: Secondary | ICD-10-CM | POA: Diagnosis present

## 2013-06-05 DIAGNOSIS — D649 Anemia, unspecified: Secondary | ICD-10-CM | POA: Diagnosis present

## 2013-06-05 DIAGNOSIS — Y92009 Unspecified place in unspecified non-institutional (private) residence as the place of occurrence of the external cause: Secondary | ICD-10-CM

## 2013-06-05 DIAGNOSIS — O4700 False labor before 37 completed weeks of gestation, unspecified trimester: Principal | ICD-10-CM | POA: Diagnosis present

## 2013-06-05 DIAGNOSIS — Z87410 Personal history of cervical dysplasia: Secondary | ICD-10-CM

## 2013-06-05 LAB — CBC
HCT: 29.6 % — ABNORMAL LOW (ref 36.0–46.0)
MCH: 27.9 pg (ref 26.0–34.0)
MCHC: 32.4 g/dL (ref 30.0–36.0)
RDW: 13.3 % (ref 11.5–15.5)

## 2013-06-05 LAB — URINALYSIS, ROUTINE W REFLEX MICROSCOPIC
Glucose, UA: NEGATIVE mg/dL
Hgb urine dipstick: NEGATIVE
Ketones, ur: NEGATIVE mg/dL
Protein, ur: NEGATIVE mg/dL
Urobilinogen, UA: 0.2 mg/dL (ref 0.0–1.0)

## 2013-06-05 LAB — URINE MICROSCOPIC-ADD ON

## 2013-06-05 LAB — WET PREP, GENITAL: Clue Cells Wet Prep HPF POC: NONE SEEN

## 2013-06-05 LAB — OB RESULTS CONSOLE GBS: GBS: NEGATIVE

## 2013-06-05 LAB — GROUP B STREP BY PCR: Group B strep by PCR: NEGATIVE

## 2013-06-05 MED ORDER — LACTATED RINGERS IV SOLN
INTRAVENOUS | Status: DC
  Administered 2013-06-05: 19:00:00 via INTRAVENOUS
  Administered 2013-06-06: 125 mL/h via INTRAVENOUS
  Administered 2013-06-06 – 2013-06-07 (×3): via INTRAVENOUS

## 2013-06-05 MED ORDER — CALCIUM CARBONATE ANTACID 500 MG PO CHEW
2.0000 | CHEWABLE_TABLET | ORAL | Status: DC | PRN
  Filled 2013-06-05: qty 2

## 2013-06-05 MED ORDER — ACETAMINOPHEN 325 MG PO TABS
650.0000 mg | ORAL_TABLET | ORAL | Status: DC | PRN
  Administered 2013-06-05 – 2013-06-08 (×4): 650 mg via ORAL
  Filled 2013-06-05 (×4): qty 2

## 2013-06-05 MED ORDER — ZOLPIDEM TARTRATE 5 MG PO TABS
5.0000 mg | ORAL_TABLET | Freq: Every evening | ORAL | Status: DC | PRN
  Administered 2013-06-05 – 2013-06-07 (×2): 5 mg via ORAL
  Filled 2013-06-05 (×2): qty 1

## 2013-06-05 MED ORDER — PENICILLIN G POTASSIUM 5000000 UNITS IJ SOLR
2.5000 10*6.[IU] | INTRAVENOUS | Status: DC
  Filled 2013-06-05 (×2): qty 2.5

## 2013-06-05 MED ORDER — PRENATAL MULTIVITAMIN CH
1.0000 | ORAL_TABLET | Freq: Every day | ORAL | Status: DC
  Administered 2013-06-06 – 2013-06-08 (×3): 1 via ORAL
  Filled 2013-06-05 (×4): qty 1

## 2013-06-05 MED ORDER — BETAMETHASONE SOD PHOS & ACET 6 (3-3) MG/ML IJ SUSP
12.0000 mg | INTRAMUSCULAR | Status: AC
  Administered 2013-06-05 – 2013-06-06 (×2): 12 mg via INTRAMUSCULAR
  Filled 2013-06-05 (×2): qty 2

## 2013-06-05 MED ORDER — MAGNESIUM SULFATE BOLUS VIA INFUSION
4.0000 g | Freq: Once | INTRAVENOUS | Status: AC
  Administered 2013-06-05: 4 g via INTRAVENOUS
  Filled 2013-06-05: qty 500

## 2013-06-05 MED ORDER — DOCUSATE SODIUM 100 MG PO CAPS
100.0000 mg | ORAL_CAPSULE | Freq: Every day | ORAL | Status: DC
  Administered 2013-06-06 – 2013-06-08 (×3): 100 mg via ORAL
  Filled 2013-06-05 (×5): qty 1

## 2013-06-05 MED ORDER — NIFEDIPINE 10 MG PO CAPS
10.0000 mg | ORAL_CAPSULE | ORAL | Status: DC | PRN
  Administered 2013-06-05 (×2): 10 mg via ORAL
  Filled 2013-06-05 (×2): qty 1

## 2013-06-05 MED ORDER — PENICILLIN G POTASSIUM 5000000 UNITS IJ SOLR
5.0000 10*6.[IU] | Freq: Once | INTRAVENOUS | Status: DC
  Administered 2013-06-05: 5 10*6.[IU] via INTRAVENOUS
  Filled 2013-06-05: qty 5

## 2013-06-05 MED ORDER — MAGNESIUM SULFATE 40 G IN LACTATED RINGERS - SIMPLE
2.0000 g/h | INTRAVENOUS | Status: AC
  Administered 2013-06-05 – 2013-06-06 (×2): 2 g/h via INTRAVENOUS
  Filled 2013-06-05 (×2): qty 500

## 2013-06-05 NOTE — H&P (Signed)
Lauren Booker is a 22 y.o. female presenting s/p fall yesterday evening, and c/o ctx that started this morning, and ?LOF. Denies any VB, +FM. States she tripped over baby bouncy about 10pm last night and fell to floor hitting her abdomen. Reports having ctx all day today, unsure of how far apart, and reports increase in discharge this afternoon, denies gush of fluid, states it is white without odor or itching. Denies any UTI sx's, no other c/o. Hx HSV, no recent outbreaks.   Prior to having [place], today Rcv'd 2 doses of procardia 10mg  PO in MAU, ctx about every 10-15 min.   HPI: Pt presented for [facility] at 24wks, previously seen for multiple visits in MAU and 1 visit (NOB workup) at [facility] at 15wks.  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   Contractions: Onset was 6-12 hours ago.   Frequency: irregular.   Duration is approximately 50 seconds.   Perceived severity is mild.    Fetal activity: Perceived fetal activity is normal.   Last perceived fetal movement was within the past hour.    Prenatal complications: Preterm labor.   Prenatal Complications - Diabetes: none.    OB History   Grav Para Term Preterm Abortions TAB SAB Ect Mult Living   2 1 1  0 0 0 0 0 0 1     G1 - May 2013, SVD, girl at 39wks, epidural, no comps G2- current preg   Past Medical History  Diagnosis Date  . Asthma   . Herpes simplex     last outbreak 2011   Past Surgical History  Procedure Laterality Date  . No past surgeries     Family History: family history includes Diabetes in her maternal grandmother and paternal grandmother and Hypertension in her maternal grandmother and paternal grandmother.  There is no history of Anesthesia problems. Social History:   reports that she has never smoked. She has never used smokeless tobacco. She reports that she does not drink alcohol or use illicit drugs.   Prenatal Transfer Tool  Maternal Diabetes: No Genetic Screening: Normal Maternal Ultrasounds/Referrals: Normal Fetal Ultrasounds or other Referrals:  None Maternal Substance Abuse:  No Significant Maternal Medications:  None Significant Maternal Lab Results:  Lab values include: Group B Strep negative Other Comments:  None  Review of Systems  All other systems reviewed and are negative.    Dilation: 2 Effacement (%): 80 Station: -1 Exam by:: S. Lillard CNM  Blood pressure 101/56, pulse 95, temperature 98 F (36.7 C), temperature source Oral, resp. rate 18, height 5\' 4"  (1.626 m), weight 150 lb 6.4 oz (68.221 kg), last menstrual period 11/08/2012. Maternal Exam:  Uterine Assessment: Contraction strength is mild.  Contraction duration is 50 seconds. Contraction frequency is irregular.   Abdomen: Patient reports no abdominal tenderness. Fundal height is aga.   Fetal presentation: vertex  Introitus: Normal vulva. Normal vagina.  amnisure neg  Pelvis: adequate for delivery.   Cervix: Cervix evaluated by sterile speculum exam and digital exam.     Fetal Exam Fetal Monitor Review: Mode: ultrasound.   Baseline rate: 140.  Variability: moderate (6-25 bpm).   Pattern: accelerations present and no decelerations.    Fetal State Assessment: Category I - tracings  are normal.     Physical Exam  Nursing note and vitals reviewed. Constitutional: She is oriented to person, place, and time. She appears well-developed and well-nourished. No distress.  HENT:  Head: Normocephalic.  Eyes: Pupils are equal, round, and reactive to light.  Neck: Normal range of motion.  Cardiovascular: Normal rate, regular rhythm and normal heart sounds.   Respiratory: Effort normal and breath sounds normal.  GI: Soft. Bowel sounds are normal.  Genitourinary:  Vagina normal.  Spec exam, mod amt white creamy d/c in vault Cervix 2+/80/-1   Musculoskeletal: Normal range of motion.  Neurological: She is alert and oriented to person, place, and time. She has normal reflexes.  Skin: Skin is warm and dry.  Psychiatric: She has a normal mood and affect. Her behavior is normal.    [place] - AFI 16.17cm WNL Cephalic presentation Placenta - posterior, above cervical os, no evidence abruption or previa BPD - c/w [redacted]w[redacted]d Cervical length .7cm Funnel length 1.1cm Funnel width 1.3cm      Prenatal labs: ABO, Rh: O/POS/-- (04/23 1131) Antibody: NEG (04/23 1131) Rubella: 5.79 (04/23 1131) RPR: NON REAC (04/23 1131)  HBsAg: NEGATIVE (04/23 1131)  HIV: NON REACTIVE (04/23 1131)  GBS:   neg by rapid PCR done 8/2, (chart flagged w positive result is from prior preg in May 2013)  1hr gtt 86, hgb 9,8, RPR NR, 7/11 Quad screen WNL 4/23 hgb electrophoresis WNL 4/23 Pap abnormal 4/23, colpo done in May c/w CIN 1 GC/CT neg 4/23      Assessment/Plan: IUP at [redacted]w[redacted]d PTL w cervical changes Wet prep neg GBS by rapid PCR today - neg FHR reassuring  Admit to antepartum per c/w Dr Richardson Dopp Routine antenatal orders  CEFM Reg diet Magnesium sulfate 4g bolus then 2g/hour BMZ course PCN per protocol initiated while GBS pending, will dc now since GBS results are neg UA culture sent GC/CT sent    LILLARD,SHELLEY M 06/05/2013, 6:43 PM   Pt seen and examined.. Plan of care discussed. Plan to continue steroids until course of bethamethasone is completed. .. Rapid GBS is negative. D/C penicllin.  Toco reviewed. Contractions currently 4- 6 per hour.

## 2013-06-05 NOTE — MAU Note (Signed)
Pt reports she tripped and fell on her stomach last night. Not c/o pain but reports some clear fluid/discharge leaking out no bleeding. Reprots fetal movement less than usual.

## 2013-06-06 LAB — URINE CULTURE

## 2013-06-06 NOTE — Progress Notes (Signed)
Taking shower.  Tolerating activity well.

## 2013-06-06 NOTE — Progress Notes (Signed)
  Subjective: Pt reports contractions have decreased. Feels approximately 2 an hour. +FM no lof no vaginal bleeding.   Objective: Vital signs in last 24 hours: Temp:  [97.5 F (36.4 C)-98.5 F (36.9 C)] 97.5 F (36.4 C) (08/03 1113) Pulse Rate:  [95-120] 120 (08/03 1113) Resp:  [18-20] 20 (08/03 1151) BP: (92-111)/(47-67) 99/59 mmHg (08/03 1113) Weight:  [68.221 kg (150 lb 6.4 oz)] 68.221 kg (150 lb 6.4 oz) (08/02 1414)    Intake/Output from previous day: 08/02 0701 - 08/03 0700 In: 2362.5 [P.O.:600; I.V.:1512.5; IV Piggyback:250] Out: 3000 [Urine:3000] Intake/Output this shift: Total I/O In: 420.8 [P.O.:150; I.V.:270.8] Out: 600 [Urine:600]  General appearance: alert and cooperative GI: gravid nontender  Extremities: extremities normal, atraumatic, no cyanosis or edema FHR baseline 140's moderate BTBV + accelerations no decelerations.  Toco no regular contractions. ... 2-3 contractions per hour noted.  Lab Results:   Recent Labs  06/05/13 2322  WBC 13.4*  HGB 9.6*  HCT 29.6*  PLT 246   BMET No results found for this basename: NA, K, CL, CO2, GLUCOSE, BUN, CREATININE, CALCIUM,  in the last 72 hours PT/INR No results found for this basename: LABPROT, INR,  in the last 72 hours ABG No results found for this basename: PHART, PCO2, PO2, HCO3,  in the last 72 hours  Studies/Results: US Ob Limited  06/05/2013   OBSTETRICAL ULTRASOUND: This exam was performed within a [facility] Department. The OB US report was generated in the AS system, and faxed to the ordering physician.   This report is also available in [employer] and in the [employer]. See AS Obstetric US report.  US Ob Transvaginal  06/05/2013   OBSTETRICAL ULTRASOUND: This exam was performed within a  Ultrasound Department. The OB US report was generated in the AS system, and faxed to the ordering physician.   This report is also available in [employer] and in the [employer]. See AS Obstetric US report.   Anti-infectives: Anti-infectives   Start     Dose/Rate Route Frequency Ordered Stop   06/05/13 2245  penicillin G potassium 2.5 Million Units in dextrose 5 % 100 mL IVPB  Status:  Discontinued     2.5 Million Units 200 mL/hr over 30 Minutes Intravenous Every 4 hours 06/05/13 1839 06/05/13 2020   06/05/13 1845  penicillin G potassium 5 Million Units in dextrose 5 % 250 mL IVPB  Status:  Discontinued     5 Million Units 250 mL/hr over 60 Minutes Intravenous  Once 06/05/13 1839 06/05/13 2020      Assessment/Plan: 30 wks and 0 days with  Preterm labor.   2nd bethamethasone due at 1900 today.  Continue magnesium sulfate until steroids complete.  GBS negative. Pcn was discontinued.    LOS: 1 day    Smitty Ackerley J. 06/06/2013

## 2013-06-06 NOTE — Progress Notes (Signed)
OOB to BR.  Assisted back to bed.  BMTZ 12 mg. IM given x1.

## 2013-06-06 NOTE — Progress Notes (Signed)
OOB to BR to shower.  Tolerated activity well.

## 2013-06-07 LAB — FETAL FIBRONECTIN: Fetal Fibronectin: POSITIVE — AB

## 2013-06-07 NOTE — Progress Notes (Signed)
Ur chart review completed.  

## 2013-06-07 NOTE — Progress Notes (Signed)
Patient ID: Lauren Booker, female   DOB: 1991/10/09, 22 y.o.   MRN: [id number] Lauren Booker is a 22 y.o. G2P1001 at [redacted]w[redacted]d by LMP admitted for Preterm labor  Subjective: GI: negative GU:  Denies: dysuria, frequency/urgency, hematuria, genital discharge, vaginal bleeding OB: Good fetal movement        Objective: BP 106/63  Pulse 90  Temp(Src) 98 F (36.7 C) (Oral)  Resp 20  Ht 5\' 4"  (1.626 m)  Wt 150 lb 6.4 oz (68.221 kg)  BMI 25.8 kg/m2  SpO2 100%  LMP 11/08/2012 I/O last 3 completed shifts: In: 8291.7 [P.O.:3320; I.V.:4721.7; IV Piggyback:250] Out: 40981 [Urine:11100] Total I/O In: 2070 [P.O.:1320; I.V.:750] Out: 1900 [Urine:1900]  FHT:  FHR: 130's bpm, variability: moderate,  accelerations:  Present,  decelerations:  Absent and   UC:   Irritability SVE:   Dilation: 2 Effacement (%): 80 Station: -1 Exam by:: S. Lillard CNM   Labs: Lab Results  Component Value Date   WBC 13.4* 06/05/2013   HGB 9.6* 06/05/2013   HCT 29.6* 06/05/2013   MCV 86.0 06/05/2013   PLT 246 06/05/2013   fetal fibronectin is positive  Assessment  Preterm labor at 30 weeks and 1 day History of delivery 15 months ago History of abdominal trauma without evidence of abruption Status post betamethasone with last dose given on 06/06/2013 Fetal Wellbeing:  Category I  Plan Continue magnesium until 24 hours after the last dose of betamethasone Consider discharge home in a.m. Modified bed rest recommended in light of positive fetal fibronectin Will need note to discontinue school  Jermichael Belmares P 06/07/2013, 3:01 PM

## 2013-06-08 NOTE — Progress Notes (Signed)
Hospital day # 3 pregnancy at [redacted]w[redacted]d  S: Sitting up in High Fowler's position in room.  Just off NST.  Feels tightening intermittently.  C/o irritation on ext genitalia.  No VB or LOF.  reports good fetal activity.  Has 22y.o. At home and nervous r/e inability to be compliant w/ BR if d/c'd home.        Contractions:none, occ'l       O: BP 107/53  Pulse 80  Temp(Src) 98.3 F (36.8 C) (Oral)  Resp 18  Ht 5\' 4"  (1.626 m)  Wt 150 lb 6.4 oz (68.221 kg)  BMI 25.8 kg/m2  SpO2 100%  LMP 11/08/2012      Fetal tracings:Fetal heart variability: moderate Fetal Heart Rate accelerations: yes Baseline FHR: 150 per minute Uterine contractions: rare reviewed and reassuring      Uterus gravid and non-tender      Extremities: no significant edema and no signs of DVT  A: [redacted]w[redacted]d with shortened cx     S/p abdominal trauma     Stable s/p BMZ; pos FFN     52mo old at home  P: continue current plan of care      Will discuss Progesterone supp pv w/ attending and future POC r/e hospitalization vs bedrest at home [facility]  MD 06/08/2013 9:38 AM

## 2013-06-08 NOTE — Progress Notes (Signed)
Patient taken off monitor after reassuring FHR. Patient laying in bed with no complaints

## 2013-06-09 NOTE — Discharge Summary (Signed)
Discharge Summary for [facility] Antenatal Obstetrical Patient  Lauren Booker  DOB:    Sep 08, 1991 MRN:    [id number] CSN:    [id number]  Date of admission:                  06/05/2013  Date of discharge:                   06/08/2013  Admission Diagnosis:  [redacted]w[redacted]d  The mother fell and hit her abdomen  Uterine contractions  History of herpes virus  Discharge Diagnosis:  Same  Positive fetal fibronectin  Short cervix  Preterm labor  Procedures this admission:  IV magnesium given and betamethasone given.  History of Present Illness:  Ms. Lauren Booker is a 22 y.o. female, G2P1001, who presents at [redacted]w[redacted]d weeks gestation. The patient has been followed at the [facility] division of [facility]. She was admitted onset of labor. Her pregnancy has been complicated by: HSV history.  Hospital course:  The patient was admitted for for management of preterm labor.   Her fetal fibronectin was positive. Her cervix was noted to be shortened to 0.7 cm. She was noted to have contractions. She was given a course of betamethasone. She was given IV magnesium. Her contractions resolved. She was observed and no further labor was noted. Gonorrhea, Chlamydia, and beta strep were negative. Her fetal fibronectin was positive. On the day of discharge she was noticing no more contractions. She was discharged to home doing well.  Discharge Physical Exam:  BP 105/54  Pulse 89  Temp(Src) 98.1 F (36.7 C) (Oral)  Resp 18  Ht 5\' 4"  (1.626 m)  Wt 150 lb 6.4 oz (68.221 kg)  BMI 25.8 kg/m2  SpO2 100%  LMP 11/08/2012   General: alert and no distress GI: soft, non-tender; bowel sounds normal; no masses,  no organomegaly Extremities: extremities normal, atraumatic, no cyanosis or edema  Exam deferred.  Discharge Information:  Activity:           pelvic rest Diet:                routine Medications: None and PNV micronized  progesterone 100 mg vaginally each day (called to pharmacy) Condition:      stable and improved Instructions:  Return to office in one week. Call if contractions increase. Consider weekly cervical measurements. Discharge to: home  Follow-up Information   Follow up with [facility] In 1 week.   Contact information:   [address] [place] [address]        Janine Limbo 06/09/2013

## 2013-06-17 ENCOUNTER — Encounter (HOSPITAL_COMMUNITY): Payer: Self-pay | Admitting: *Deleted

## 2013-06-17 ENCOUNTER — Inpatient Hospital Stay (HOSPITAL_COMMUNITY)
Admission: AD | Admit: 2013-06-17 | Discharge: 2013-06-17 | Disposition: A | Discharge status: Home / Self Care | Payer: Medicaid Other | Source: Ambulatory Visit | Attending: Obstetrics and Gynecology | Admitting: Obstetrics and Gynecology

## 2013-06-17 ENCOUNTER — Inpatient Hospital Stay (HOSPITAL_COMMUNITY): Payer: Medicaid Other

## 2013-06-17 DIAGNOSIS — D649 Anemia, unspecified: Secondary | ICD-10-CM

## 2013-06-17 DIAGNOSIS — N949 Unspecified condition associated with female genital organs and menstrual cycle: Secondary | ICD-10-CM | POA: Insufficient documentation

## 2013-06-17 DIAGNOSIS — O4700 False labor before 37 completed weeks of gestation, unspecified trimester: Secondary | ICD-10-CM | POA: Insufficient documentation

## 2013-06-17 DIAGNOSIS — R109 Unspecified abdominal pain: Secondary | ICD-10-CM | POA: Insufficient documentation

## 2013-06-17 LAB — FETAL FIBRONECTIN: Fetal Fibronectin: NEGATIVE

## 2013-06-17 LAB — URINALYSIS, ROUTINE W REFLEX MICROSCOPIC
Bilirubin Urine: NEGATIVE
Nitrite: NEGATIVE
Specific Gravity, Urine: 1.02 (ref 1.005–1.030)
Urobilinogen, UA: 0.2 mg/dL (ref 0.0–1.0)
pH: 6.5 (ref 5.0–8.0)

## 2013-06-17 LAB — URINE MICROSCOPIC-ADD ON

## 2013-06-17 LAB — WET PREP, GENITAL: Clue Cells Wet Prep HPF POC: NONE SEEN

## 2013-06-17 NOTE — MAU Provider Note (Signed)
History   22 yo G2P1001 at [redacted]w[redacted]d presents to MAU with pelvic pressure yesterday with a little cramping. Feels like the baby is very low.  Cramping and back discomforts stopped when she got to MAU.  Denies VB, LOF, recent fever, resp or GI c/o's, UTI or PIH s/s. GFM.   Recent admission to Antenatal for PTL - BMX x2 and Mag; d/c'd 8/6  Chief Complaint  Patient presents with  . Pelvic Pain    OB History   Grav Para Term Preterm Abortions TAB SAB Ect Mult Living   2 1 1  0 0 0 0 0 0 1      Past Medical History  Diagnosis Date  . Asthma   . Herpes simplex     last outbreak 2011    Past Surgical History  Procedure Laterality Date  . No past surgeries      Family History  Problem Relation Age of Onset  . Hypertension Maternal Grandmother   . Diabetes Maternal Grandmother   . Hypertension Paternal Grandmother   . Diabetes Paternal Grandmother   . Anesthesia problems Neg Hx     History  Substance Use Topics  . Smoking status: Never Smoker   . Smokeless tobacco: Never Used  . Alcohol Use: No    Allergies: No Known Allergies  Prescriptions prior to admission  Medication Sig Dispense Refill  . Prenatal Vit-Fe Fumarate-FA (PRENATAL MULTIVITAMIN) TABS Take 1 tablet by mouth daily.  90 tablet  3  . progesterone 200 MG SUPP Place 200 mg vaginally at bedtime.      Marland Kitchen albuterol (PROVENTIL HFA;VENTOLIN HFA) 108 (90 BASE) MCG/ACT inhaler Inhale 2 puffs into the lungs every 6 (six) hours as needed for wheezing.        ROS: see HPI above, all other systems are negative   Physical Exam   Blood pressure 107/66, pulse 110, resp. rate 16, height 5\' 4"  (1.626 m), weight 146 lb 6.4 oz (66.407 kg), last menstrual period 11/08/2012, SpO2 100.00%.  Chest: Clear Heart: RRR Abdomen: gravid, NT Extremities: WNL  Pelvic exam: normal external genitalia, vulva, vagina, cervix, uterus and adnexa,  vaginal discharge - white, copious and creamy.  Dilation: 1.5 Effacement (%): 70 Cervical  Position: Posterior Station: -2 Presentation: Vertex Exam by:: J.Jayse Hodkinson,CNM  FHT:  Reactive NST UCs: Irregular, mild  ED Course  IUP at [redacted]w[redacted]d ?PTL  FFN - neg Wet prep - neg UA - neg U/S - cervix stable  D/c home with PTL precautions F/u at [facility] on 8/21 for ROB and U/S   Haroldine Laws CNM, MSN 06/17/2013 12:22 PM

## 2013-06-17 NOTE — MAU Note (Signed)
Patient states she had pelvic pressure yesterday with a little cramping. Feels like the baby is very low. Denies bleeding or leaking and reports good fetal movement.

## 2013-07-26 ENCOUNTER — Encounter (HOSPITAL_COMMUNITY): Payer: Self-pay | Admitting: *Deleted

## 2013-07-26 ENCOUNTER — Inpatient Hospital Stay (HOSPITAL_COMMUNITY)
Admission: AD | Admit: 2013-07-26 | Discharge: 2013-07-26 | DRG: 780 | Disposition: A | Discharge status: Home / Self Care | Payer: Medicaid Other | Source: Ambulatory Visit | Attending: Obstetrics and Gynecology | Admitting: Obstetrics and Gynecology

## 2013-07-26 ENCOUNTER — Encounter (HOSPITAL_COMMUNITY): Payer: Self-pay | Admitting: Anesthesiology

## 2013-07-26 ENCOUNTER — Inpatient Hospital Stay (HOSPITAL_COMMUNITY): Payer: Medicaid Other | Admitting: Anesthesiology

## 2013-07-26 ENCOUNTER — Inpatient Hospital Stay (HOSPITAL_COMMUNITY)
Admission: AD | Admit: 2013-07-26 | Discharge: 2013-07-28 | DRG: 774 | Disposition: A | Discharge status: Home / Self Care | Payer: Medicaid Other | Source: Ambulatory Visit | Attending: Obstetrics and Gynecology | Admitting: Obstetrics and Gynecology

## 2013-07-26 DIAGNOSIS — O26879 Cervical shortening, unspecified trimester: Principal | ICD-10-CM | POA: Diagnosis present

## 2013-07-26 DIAGNOSIS — A6000 Herpesviral infection of urogenital system, unspecified: Secondary | ICD-10-CM | POA: Diagnosis present

## 2013-07-26 DIAGNOSIS — D649 Anemia, unspecified: Secondary | ICD-10-CM

## 2013-07-26 DIAGNOSIS — O98519 Other viral diseases complicating pregnancy, unspecified trimester: Secondary | ICD-10-CM | POA: Diagnosis present

## 2013-07-26 DIAGNOSIS — O479 False labor, unspecified: Principal | ICD-10-CM | POA: Diagnosis present

## 2013-07-26 DIAGNOSIS — O358XX0 Maternal care for other (suspected) fetal abnormality and damage, not applicable or unspecified: Secondary | ICD-10-CM | POA: Diagnosis present

## 2013-07-26 LAB — CBC
Hemoglobin: 9.9 g/dL — ABNORMAL LOW (ref 12.0–15.0)
MCH: 25.4 pg — ABNORMAL LOW (ref 26.0–34.0)
MCV: 81.3 fL (ref 78.0–100.0)
Platelets: 292 10*3/uL (ref 150–400)
RBC: 3.9 MIL/uL (ref 3.87–5.11)

## 2013-07-26 MED ORDER — IBUPROFEN 600 MG PO TABS
600.0000 mg | ORAL_TABLET | Freq: Four times a day (QID) | ORAL | Status: DC | PRN
  Administered 2013-07-27: 600 mg via ORAL
  Filled 2013-07-26: qty 1

## 2013-07-26 MED ORDER — LACTATED RINGERS IV SOLN
INTRAVENOUS | Status: DC
  Administered 2013-07-26: via INTRAVENOUS

## 2013-07-26 MED ORDER — LACTATED RINGERS IV SOLN
INTRAVENOUS | Status: DC
  Administered 2013-07-26: 21:00:00 via INTRAVENOUS

## 2013-07-26 MED ORDER — ZOLPIDEM TARTRATE 5 MG PO TABS: 5.0000 mg | ORAL_TABLET | Freq: Every evening | ORAL | Status: DC | PRN

## 2013-07-26 MED ORDER — ACETAMINOPHEN 325 MG PO TABS: 650.0000 mg | ORAL_TABLET | ORAL | Status: DC | PRN

## 2013-07-26 MED ORDER — LACTATED RINGERS IV SOLN
INTRAVENOUS | Status: DC
  Administered 2013-07-26: 12:00:00 via INTRAVENOUS

## 2013-07-26 MED ORDER — FENTANYL CITRATE 0.05 MG/ML IJ SOLN
100.0000 ug | Freq: Once | INTRAMUSCULAR | Status: AC
  Administered 2013-07-26: 100 ug via INTRAVENOUS
  Filled 2013-07-26: qty 2

## 2013-07-26 MED ORDER — PHENYLEPHRINE 40 MCG/ML (10ML) SYRINGE FOR IV PUSH (FOR BLOOD PRESSURE SUPPORT)
80.0000 ug | PREFILLED_SYRINGE | INTRAVENOUS | Status: DC | PRN
  Filled 2013-07-26: qty 2

## 2013-07-26 MED ORDER — LACTATED RINGERS IV SOLN
500.0000 mL | Freq: Once | INTRAVENOUS | Status: AC
  Administered 2013-07-26: 500 mL via INTRAVENOUS

## 2013-07-26 MED ORDER — OXYCODONE-ACETAMINOPHEN 5-325 MG PO TABS
1.0000 | ORAL_TABLET | ORAL | Status: DC | PRN
  Administered 2013-07-27: 1 via ORAL
  Filled 2013-07-26: qty 1

## 2013-07-26 MED ORDER — CITRIC ACID-SODIUM CITRATE 334-500 MG/5ML PO SOLN: 30.0000 mL | ORAL | Status: DC | PRN

## 2013-07-26 MED ORDER — OXYTOCIN BOLUS FROM INFUSION
500.0000 mL | INTRAVENOUS | Status: DC
  Administered 2013-07-27: 500 mL via INTRAVENOUS

## 2013-07-26 MED ORDER — PHENYLEPHRINE 40 MCG/ML (10ML) SYRINGE FOR IV PUSH (FOR BLOOD PRESSURE SUPPORT)
80.0000 ug | PREFILLED_SYRINGE | INTRAVENOUS | Status: DC | PRN
  Filled 2013-07-26: qty 5
  Filled 2013-07-26: qty 2

## 2013-07-26 MED ORDER — VALACYCLOVIR HCL 1 G PO TABS: 1000.0000 mg | ORAL_TABLET | Freq: Every day | ORAL | Status: DC

## 2013-07-26 MED ORDER — EPHEDRINE 5 MG/ML INJ
10.0000 mg | INTRAVENOUS | Status: DC | PRN
  Filled 2013-07-26: qty 2
  Filled 2013-07-26: qty 4

## 2013-07-26 MED ORDER — DIPHENHYDRAMINE HCL 50 MG/ML IJ SOLN: 12.5000 mg | INTRAMUSCULAR | Status: DC | PRN

## 2013-07-26 MED ORDER — LIDOCAINE HCL (PF) 1 % IJ SOLN
30.0000 mL | INTRAMUSCULAR | Status: DC | PRN
  Filled 2013-07-26 (×2): qty 30

## 2013-07-26 MED ORDER — LACTATED RINGERS IV SOLN: 500.0000 mL | INTRAVENOUS | Status: DC | PRN

## 2013-07-26 MED ORDER — FLEET ENEMA 7-19 GM/118ML RE ENEM: 1.0000 | ENEMA | RECTAL | Status: DC | PRN

## 2013-07-26 MED ORDER — FENTANYL 2.5 MCG/ML BUPIVACAINE 1/10 % EPIDURAL INFUSION (WH - ANES)
14.0000 mL/h | INTRAMUSCULAR | Status: DC | PRN
  Filled 2013-07-26: qty 125

## 2013-07-26 MED ORDER — EPHEDRINE 5 MG/ML INJ
10.0000 mg | INTRAVENOUS | Status: DC | PRN
  Filled 2013-07-26: qty 2

## 2013-07-26 MED ORDER — ONDANSETRON HCL 4 MG/2ML IJ SOLN: 4.0000 mg | Freq: Four times a day (QID) | INTRAMUSCULAR | Status: DC | PRN

## 2013-07-26 MED ORDER — OXYTOCIN 40 UNITS IN LACTATED RINGERS INFUSION - SIMPLE MED
62.5000 mL/h | INTRAVENOUS | Status: DC
  Filled 2013-07-26: qty 1000

## 2013-07-26 NOTE — MAU Note (Signed)
Pt has been contracting all day and was seen at [facility] @ was told she was 4cm and was sent home.  Contractions appeared to get stronger per pt and she came in to MAU, Denies bleeding or leaking, reports fetal movement.

## 2013-07-26 NOTE — Progress Notes (Signed)
Reviewed strip with Dr. Pennie Rushing, will watch patient another hour prior to discharging home. Camelia Eng, RN

## 2013-07-26 NOTE — Anesthesia Preprocedure Evaluation (Signed)
Anesthesia Evaluation  Patient identified by MRN, date of birth, ID band Patient awake    Reviewed: Allergy & Precautions, H&P , Patient's Chart, lab work & pertinent test results  Airway Mallampati: II TM Distance: >3 FB Neck ROM: full    Dental  (+) Teeth Intact   Pulmonary  breath sounds clear to auscultation        Cardiovascular Rhythm:regular Rate:Normal     Neuro/Psych    GI/Hepatic   Endo/Other    Renal/GU      Musculoskeletal   Abdominal   Peds  Hematology  (+) anemia ,   Anesthesia Other Findings Rare inhaler use      Reproductive/Obstetrics (+) Pregnancy                           Anesthesia Physical Anesthesia Plan  ASA: II  Anesthesia Plan: Epidural   Post-op Pain Management:    Induction:   Airway Management Planned:   Additional Equipment:   Intra-op Plan:   Post-operative Plan:   Informed Consent: I have reviewed the patients History and Physical, chart, labs and discussed the procedure including the risks, benefits and alternatives for the proposed anesthesia with the patient or authorized representative who has indicated his/her understanding and acceptance.   Dental Advisory Given  Plan Discussed with:   Anesthesia Plan Comments: (Labs checked- platelets confirmed with RN in room. Fetal heart tracing, per RN, reported to be stable enough for sitting procedure. Discussed epidural, and patient consents to the procedure:  included risk of possible headache,backache, failed block, allergic reaction, and nerve injury. This patient was asked if she had any questions or concerns before the procedure started. )        Anesthesia Quick Evaluation

## 2013-07-26 NOTE — Discharge Summary (Addendum)
  Physician Discharge Summary  Patient ID: ASTER SCREWS MRN: [id number] DOB/AGE: 22-25-1992 22 y.o.  Admit date: 07/26/2013 Discharge date: 07/26/2013  Admission Diagnoses: Labaor  Discharge Diagnoses:  False labor  Discharged Condition: good  Hospital Course: The patient was seen at [facility] this am complaining of cramping and leaking fluid.  Evaluation revealed no ROM but cervix was throught to be 7 cm.  She was instructed to proceed to L&D.  She presented 2hrs later to [facility] and was felt to be 4-5cm.  After walking within the hospital for an hour I rechecked her cervix and she was 4 cm.   Consults: None  Significant Diagnostic Studies: NST  Treatments: IV hydration  Discharge Exam: Blood pressure 102/67, pulse 93, temperature 98.2 F (36.8 C), temperature source Oral, resp. rate 18, height 5\' 4"  (1.626 m), weight 164 lb (74.39 kg), last menstrual period 11/08/2012. General appearance: alert, cooperative and no distress Pelvic: No vulvar lesions.  Cx unchanged after ambulation  Disposition: 01-Home or Self Care Labor precautions given  Monitor strip. Uterine irritability.  Moderate variability.  Plus accelerations. No decelerations.  Class I     Medication List         albuterol 108 (90 BASE) MCG/ACT inhaler  Commonly known as:  PROVENTIL HFA;VENTOLIN HFA  Inhale 2 puffs into the lungs every 6 (six) hours as needed for wheezing.     prenatal multivitamin Tabs tablet  Take 1 tablet by mouth daily.     valACYclovir 1000 MG tablet  Commonly known as:  VALTREX  Take 1 tablet (1,000 mg total) by mouth daily.     zolpidem 5 MG tablet  Commonly known as:  AMBIEN  Take 1 tablet (5 mg total) by mouth at bedtime as needed for sleep.       Follow-up Information   Follow up with [facility] On 08/02/2013. (pt has appt)    Specialty:  Obstetrics and Gynecology   Contact information:   [address] [place]  [id number] [phone]      Signed: Hal Morales 07/26/2013, 5:13 PM   Pt began having increased contractions at previous time of discharge so was watched for 1 hr without change in cervix and with reassuring tracing.  Will plan d/c home with labor precautions.  She requests sleep aid and is given Ambien.  She has not been taking prophylactic Valtrex because she had only a single outbreak in 2011 and requests repeat HSV testing.  She is encouraged to take Valtrex until results are available.

## 2013-07-26 NOTE — H&P (Signed)
Lauren Booker is a 22 y.o. female, G2P1001 at [redacted]w[redacted]d, presenting for active labor. Denies VB, LOF, recent fever, resp or GI c/o's, UTI or PIH s/s. GFM. Desires epidural.  Patient Active Problem List   Diagnosis Date Noted  . Short cervix in third trimester, antepartum 06/05/2013  . Papanicolaou smear of cervix with low grade squamous intraepithelial lesion (LGSIL) 02/26/2013  . Beta hemolytic streptococcus urinary tract infection complicating pregnancy 03/04/2012  . Fetal anomaly 02/19/2012  . Asthma 02/19/2012  . 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 Anemia 09/14/2011    History of present pregnancy: Patient entered care at 15 weeks.   EDC of 08/15/13 was established by LMP.   Anatomy scan:  18 weeks, with normal findings and an posterior placenta.   Additional [place] evaluations:  31 weeks for PTL Funneling noted, dynamic cervix.  36 weeks for presentation - vtx Significant prenatal events:  Cervical shortening at 32 weeks; BMX and bedrest, continue progesterone   Last evaluation:  07/26/13 at [redacted]w[redacted]d 7cm / 80% / -2 sent to MAU to be checked - was 4 cm with no cervical change and sent home  OB History   Grav Para Term Preterm Abortions TAB SAB Ect Mult Living   2 1 1  0 0 0 0 0 0 1     Past Medical History  Diagnosis Date  . Asthma   . Herpes simplex     last outbreak 2011   Past Surgical History  Procedure Laterality Date  . No past surgeries     Family History: family history includes Diabetes in her maternal grandmother and paternal grandmother; Hypertension in her maternal grandmother and paternal grandmother. There is no history of Anesthesia problems. Social History:  reports that she has never smoked. She has never used smokeless tobacco. She reports that she does not drink alcohol or use illicit drugs.   Prenatal Transfer Tool  Maternal Diabetes: No Genetic Screening: Normal Maternal Ultrasounds/Referrals: Normal Fetal Ultrasounds or other Referrals:   None Maternal Substance Abuse:  No Significant Maternal Medications:  None Significant Maternal Lab Results: Lab values include: Group B Strep negative    ROS: see HPI above, all other systems are negative  No Known Allergies   Dilation: 6 Effacement (%): 90 Station: -1 Exam by:: J. Oxely CNM Blood pressure 118/81, pulse 113, temperature 98.4 F (36.9 C), temperature source Oral, resp. rate 16, last menstrual period 11/08/2012.  Chest clear Heart RRR without murmur Abd gravid, NT Ext: WNL  Unable to do speculum exam d/t being 9cm and 0. No lesions noted on or around labia/vulva/introitus  FHR: Reassuring but not yet reactive; CTO UCs: Q 2-3 min  Prenatal labs: ABO, Rh: O/POS/-- (04/23 1131) Antibody: NEG (04/23 1131) Rubella:   Immune RPR: NON REAC (04/23 1131)  HBsAg: NEGATIVE (04/23 1131)  HIV: NON REACTIVE (04/23 1131)  GBS: Negative (09/15 0000) Sickle cell/Hgb electrophoresis:  Normal Study Pap:  02/24/13  LSIL: CIN 1 / HPV GC:  Neg Chlamydia:  Neg Genetic screenings:  Quad neg Glucola:  86 Other:  none    Assessment/Plan: IUP at [redacted]w[redacted]d Active labor GBS neg HSV +: pt reports she has not been taking her valtrax, but reports no prodromal sensations. Abnormal Pap  Admit to BS  Routine admit orders Epidural prn Pap - f/u 1 year    Rowan Blase, MSN 07/26/2013, 9:00 PM

## 2013-07-27 ENCOUNTER — Encounter (HOSPITAL_COMMUNITY): Payer: Self-pay | Admitting: *Deleted

## 2013-07-27 LAB — CBC
HCT: 27.1 % — ABNORMAL LOW (ref 36.0–46.0)
Hemoglobin: 8.6 g/dL — ABNORMAL LOW (ref 12.0–15.0)
MCH: 25.6 pg — ABNORMAL LOW (ref 26.0–34.0)
MCV: 80.7 fL (ref 78.0–100.0)
RBC: 3.36 MIL/uL — ABNORMAL LOW (ref 3.87–5.11)
WBC: 16.3 10*3/uL — ABNORMAL HIGH (ref 4.0–10.5)

## 2013-07-27 LAB — HSV(HERPES SMPLX)ABS-I+II(IGG+IGM)-BLD: HSV 1 Glycoprotein G Ab, IgG: 4.74 IV — ABNORMAL HIGH

## 2013-07-27 MED ORDER — WITCH HAZEL-GLYCERIN EX PADS: 1.0000 "application " | MEDICATED_PAD | CUTANEOUS | Status: DC | PRN

## 2013-07-27 MED ORDER — SIMETHICONE 80 MG PO CHEW: 80.0000 mg | CHEWABLE_TABLET | ORAL | Status: DC | PRN

## 2013-07-27 MED ORDER — IBUPROFEN 600 MG PO TABS
600.0000 mg | ORAL_TABLET | Freq: Four times a day (QID) | ORAL | Status: DC
  Administered 2013-07-27 – 2013-07-28 (×6): 600 mg via ORAL
  Filled 2013-07-27 (×6): qty 1

## 2013-07-27 MED ORDER — TETANUS-DIPHTH-ACELL PERTUSSIS 5-2.5-18.5 LF-MCG/0.5 IM SUSP
0.5000 mL | Freq: Once | INTRAMUSCULAR | Status: AC
  Administered 2013-07-28: 0.5 mL via INTRAMUSCULAR
  Filled 2013-07-27: qty 0.5

## 2013-07-27 MED ORDER — ZOLPIDEM TARTRATE 5 MG PO TABS: 5.0000 mg | ORAL_TABLET | Freq: Every evening | ORAL | Status: DC | PRN

## 2013-07-27 MED ORDER — PNEUMOCOCCAL VAC POLYVALENT 25 MCG/0.5ML IJ INJ
0.5000 mL | INJECTION | INTRAMUSCULAR | Status: DC
  Filled 2013-07-27: qty 0.5

## 2013-07-27 MED ORDER — DIBUCAINE 1 % RE OINT: 1.0000 "application " | TOPICAL_OINTMENT | RECTAL | Status: DC | PRN

## 2013-07-27 MED ORDER — PRENATAL MULTIVITAMIN CH
1.0000 | ORAL_TABLET | Freq: Every day | ORAL | Status: DC
  Administered 2013-07-27 – 2013-07-28 (×2): 1 via ORAL
  Filled 2013-07-27 (×2): qty 1

## 2013-07-27 MED ORDER — DIPHENHYDRAMINE HCL 25 MG PO CAPS
25.0000 mg | ORAL_CAPSULE | Freq: Four times a day (QID) | ORAL | Status: DC | PRN
  Administered 2013-07-27: 25 mg via ORAL
  Filled 2013-07-27: qty 1

## 2013-07-27 MED ORDER — ONDANSETRON HCL 4 MG PO TABS: 4.0000 mg | ORAL_TABLET | ORAL | Status: DC | PRN

## 2013-07-27 MED ORDER — BENZOCAINE-MENTHOL 20-0.5 % EX AERO
1.0000 "application " | INHALATION_SPRAY | CUTANEOUS | Status: DC | PRN
  Administered 2013-07-27: 1 via TOPICAL
  Filled 2013-07-27: qty 56

## 2013-07-27 MED ORDER — OXYCODONE-ACETAMINOPHEN 5-325 MG PO TABS
1.0000 | ORAL_TABLET | ORAL | Status: DC | PRN
  Administered 2013-07-27: 1 via ORAL
  Filled 2013-07-27: qty 1

## 2013-07-27 MED ORDER — SENNOSIDES-DOCUSATE SODIUM 8.6-50 MG PO TABS
2.0000 | ORAL_TABLET | ORAL | Status: DC
  Administered 2013-07-28: 2 via ORAL

## 2013-07-27 MED ORDER — ONDANSETRON HCL 4 MG/2ML IJ SOLN: 4.0000 mg | INTRAMUSCULAR | Status: DC | PRN

## 2013-07-27 MED ORDER — INFLUENZA VAC SPLIT QUAD 0.5 ML IM SUSP
0.5000 mL | INTRAMUSCULAR | Status: AC
  Administered 2013-07-27: 0.5 mL via INTRAMUSCULAR
  Filled 2013-07-27: qty 0.5

## 2013-07-27 MED ORDER — LANOLIN HYDROUS EX OINT: TOPICAL_OINTMENT | CUTANEOUS | Status: DC | PRN

## 2013-07-27 NOTE — Lactation Note (Signed)
This note was copied from the chart of Lauren Booker. Lactation Consultation Note  Patient Name: Lauren May Manrique OZHYQ'M Date: 07/27/2013 Reason for consult: Initial assessment of this second time mother and baby at 21 hours post-delivery.  Mom is an experienced breastfeeding mother, but states her 38 month old daughter weaned herself at 51 months of age.  She states she knows how to hand express milk and denies any latching difficulties but she is planning to both breast and formula-feed this baby.  discussed reasons to avoid supplement for first 2 weeks to avoid consequences based on "LEAD" (lower milk supply, engorgement, allergies and other consequences of baby receiving formula, decreased confidence of mom in BF ability). LC provided [employer] brochure and reviewed [facility] services and list of community and web site resources.      Maternal Data Formula Feeding for Exclusion: Yes Reason for exclusion: Mother's choice to formula and breast feed on admission Infant to breast within first hour of birth: Yes (initial LATCH score=7) Has patient been taught Hand Expression?: Yes (mom states she knows how to hand express) Does the patient have breastfeeding experience prior to this delivery?: Yes  Feeding    LATCH Score/Interventions           most recent LATCH score=8 per RN assessment           Lactation Tools Discussed/Used   STS, cue feedings, hand expression LEAD guidelines  Consult Status Consult Status: Follow-up Date: 07/28/13 Follow-up type: In-patient    Warrick Parisian [facility] 07/27/2013, 11:19 PM

## 2013-07-27 NOTE — Anesthesia Postprocedure Evaluation (Signed)
Anesthesia Post Note  Patient: Lauren Booker  Procedure(s) Performed: * No procedures listed *  Anesthesia type: Epidural  Patient location: Mother/Baby  Post pain: Pain level controlled  Post assessment: Post-op Vital signs reviewed  Last Vitals:  Filed Vitals:   07/27/13 0730  BP: 95/65  Pulse: 99  Temp: 36.6 C  Resp: 18    Post vital signs: Reviewed  Level of consciousness:alert  Complications: No apparent anesthesia complications 

## 2013-07-27 NOTE — Anesthesia Postprocedure Evaluation (Signed)
Anesthesia Post Note  Patient: Lauren Booker  Procedure(s) Performed: * No procedures listed *  Anesthesia type: Epidural  Patient location: Mother/Baby  Post pain: Pain level controlled  Post assessment: Post-op Vital signs reviewed  Last Vitals:  Filed Vitals:   07/27/13 0730  BP: 95/65  Pulse: 99  Temp: 36.6 C  Resp: 18    Post vital signs: Reviewed  Level of consciousness:alert  Complications: No apparent anesthesia complications

## 2013-07-27 NOTE — Progress Notes (Signed)
UR chart review completed.  

## 2013-07-28 MED ORDER — IBUPROFEN 600 MG PO TABS: 600.0000 mg | ORAL_TABLET | Freq: Four times a day (QID) | ORAL | Status: DC

## 2013-07-28 MED ORDER — OXYCODONE-ACETAMINOPHEN 5-325 MG PO TABS: 1.0000 | ORAL_TABLET | Freq: Four times a day (QID) | ORAL | Status: DC | PRN

## 2013-07-28 NOTE — Discharge Summary (Signed)
Obstetric Discharge Summary Reason for Admission: onset of labor Prenatal Procedures: none Intrapartum Procedures: spontaneous vaginal delivery Postpartum Procedures: none Complications-Operative and Postpartum: none Hemoglobin  Date Value Range Status  07/27/2013 8.6* 12.0 - 15.0 g/dL Final     HCT  Date Value Range Status  07/27/2013 27.1* 36.0 - 46.0 % Final    Physical Exam:  General: alert and cooperative Lochia: appropriate Uterine Fundus: firm Incision: na DVT Evaluation: No evidence of DVT seen on physical exam.  Discharge Diagnoses: Term Pregnancy-delivered  Discharge Information: Date: 07/28/2013 Activity: pelvic rest Diet: routine Medications: PNV, Ibuprofen, Percocet and pt asked for percocet Condition: stable Instructions: refer to practice specific booklet Discharge to: home Follow-up Information   Follow up with [facility]. Schedule an appointment as soon as possible for a visit in 6 weeks.   Specialty:  Obstetrics and Gynecology   Contact information:   [address] [place] [address] [phone]      Newborn Data: Live born female  Birth Weight: 7 lb 13.2 oz (3550 g) APGAR: 7, 9  Home with mother.  Staci Carver A 07/28/2013, 11:58 AM

## 2013-11-11 ENCOUNTER — Ambulatory Visit (HOSPITAL_COMMUNITY)
Admission: RE | Admit: 2013-11-11 | Discharge: 2013-11-11 | Disposition: A | Discharge status: Home / Self Care | Payer: Medicaid Other | Source: Ambulatory Visit | Attending: Obstetrics and Gynecology | Admitting: Obstetrics and Gynecology

## 2013-11-11 NOTE — Lactation Note (Addendum)
Adult Lactation Consultation Outpatient Visit Note  Patient Name: Lauren StacksShakilia T Booker                                    ZHYQ'MBaby's Name:  Arnette SchaumannKhalai Jankowiak Date of Birth: 22-May-1991                                                Baby's DOB:  [birth year] Gestational Age at Delivery: 37 2/7 weeks                   Birth weight:  7 lbs 13.3 oz Type of Delivery: SVD                                                  Today's weight: 13 lbs 13 oz  Breastfeeding History: Frequency of Breastfeeding: every 1-3 hrs Length of Feeding: 20 mins Voids: 12 per day Stools: 2-3 per day (yellow and seedy)  Supplementing / Method: Pumping:  Type of Pump: none  Supplement: bottle of formula when with FOB, unknown amount    Comments: Lauren Booker made appointment to "see what could be done with baby nursing on right breast more".  Right breast is larger in size, which Toni doesn't like.  She also says baby wants to nurse all night on her right breast and it is sore.  No noticeable trauma noted on either nipple.  Talked about how she is feeding her baby.  Lauren Booker states she probably puts baby on the left breast 2 times a day, the rest of the feedings are on the right side.  She denies feeding [place] formula as "he doesn't like it". Talked about supply meeting demand, and the need for regular breast stimulation.  Talked about how normal it is for baby to prefer breast that is producing more milk. Talked about using an electric pump, or a manual pump to stimulate milk flow.  Gave her a manual Harmony breast pump, and initiated flow with this.  Milk easily expressed with Harmony pump.  Latched baby onto breast in football hold, but baby clicking and restless at times.  Encouraged using breast compression while baby breast feeding.  After about 15 mins, switched baby onto right breast and he settled into a better pattern and took 2 oz in 10 minutes.  Reassured Lauren Booker that her right breast should produce enough milk for her baby, and not  to worry.  Recommendations:  -To begin feeding on the left breast for next couple days -Pump left breast 4 times a day 15-20 minutes (Hand pump given) -[place], [place], Oatmeal etc discussed -Breastfeeding Support Groups -call prn       11/11/2013, 2:08 PM

## 2014-09-05 ENCOUNTER — Encounter (HOSPITAL_COMMUNITY): Payer: Self-pay | Admitting: *Deleted

## 2014-09-29 IMAGING — US US OB TRANSVAGINAL
1 series · 11 of 11 positions shown · non-contrast
Comparison: none

[Series 1: us ob transvaginal · 11 acquisitions, 11 frames shown]
[im 1/11]
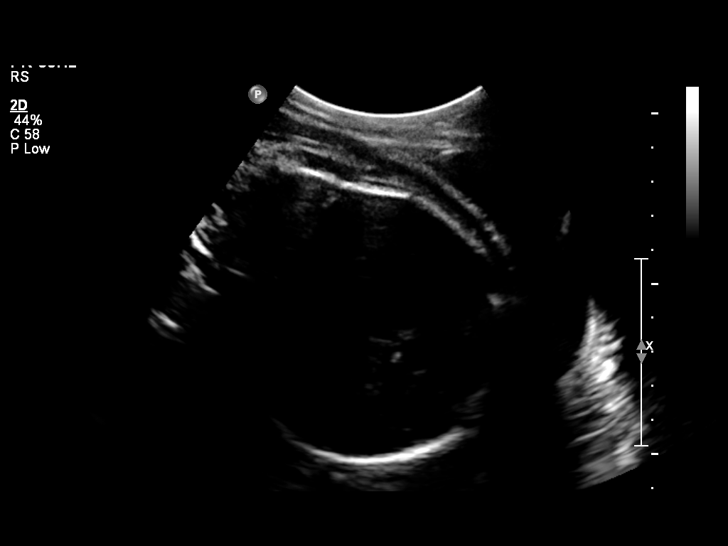
[im 2/11]
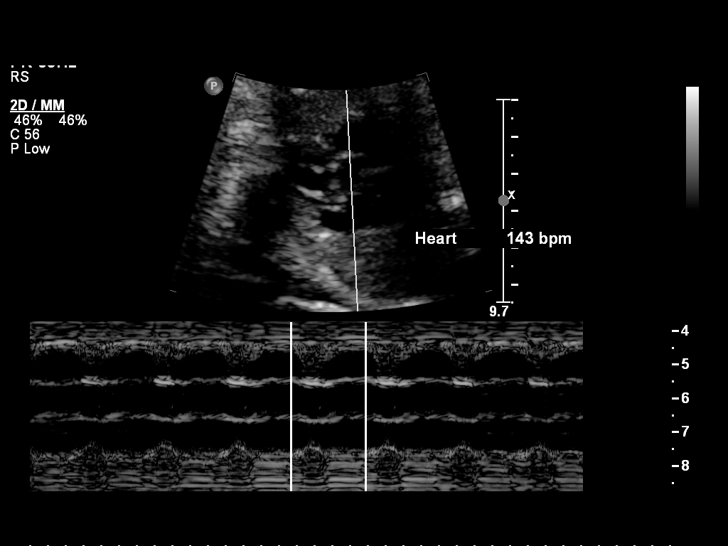
[im 3/11]
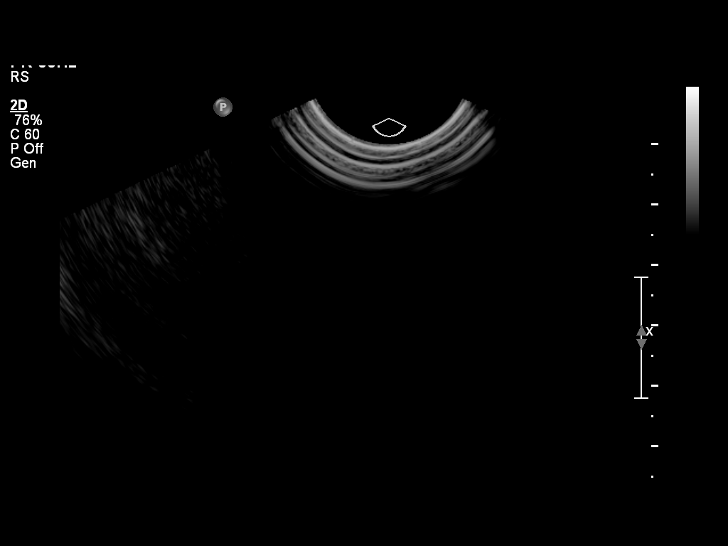
[im 4/11]
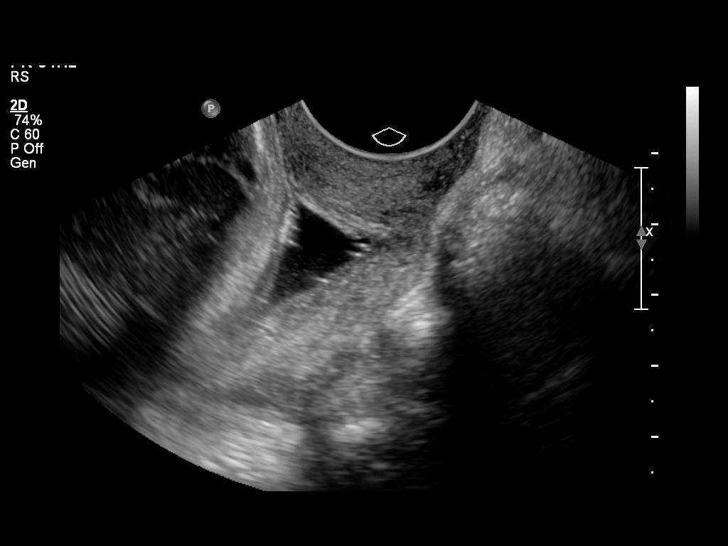
[im 5/11]
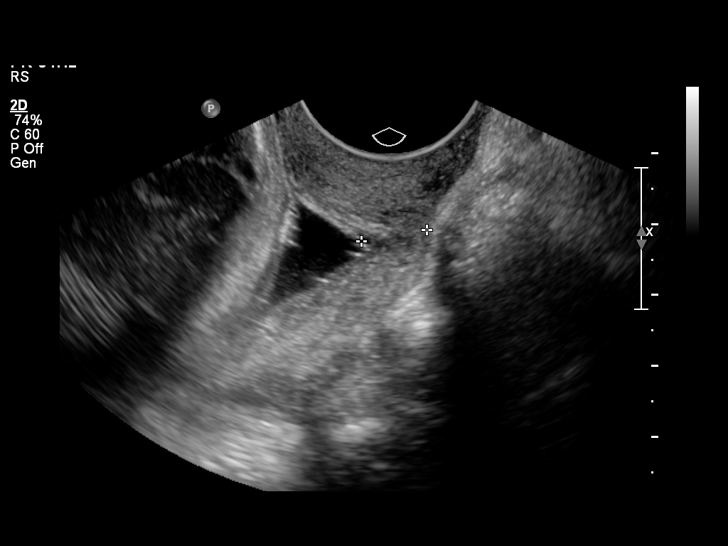
[im 6/11]
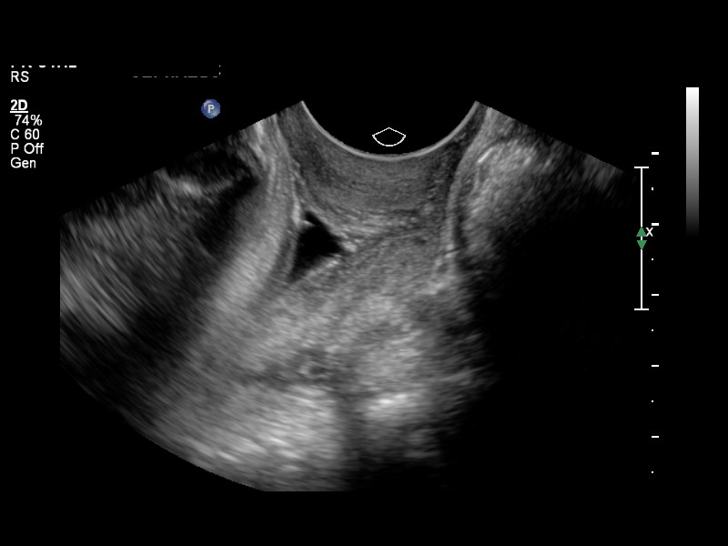
[im 7/11]
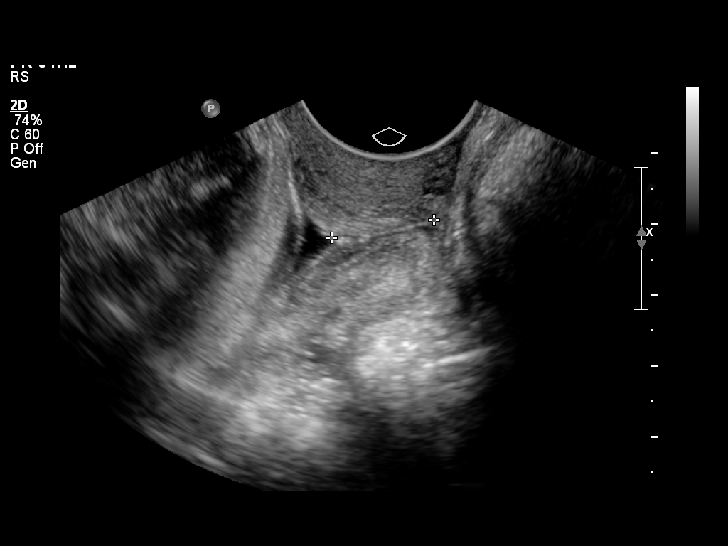
[im 8/11]
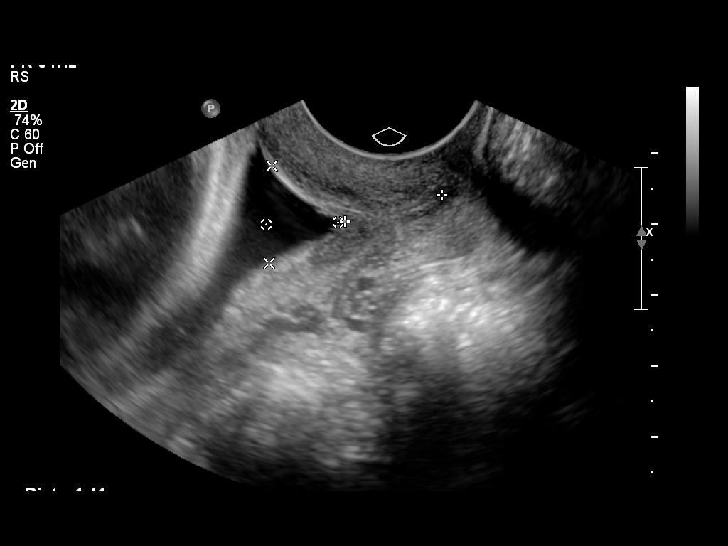
[im 9/11]
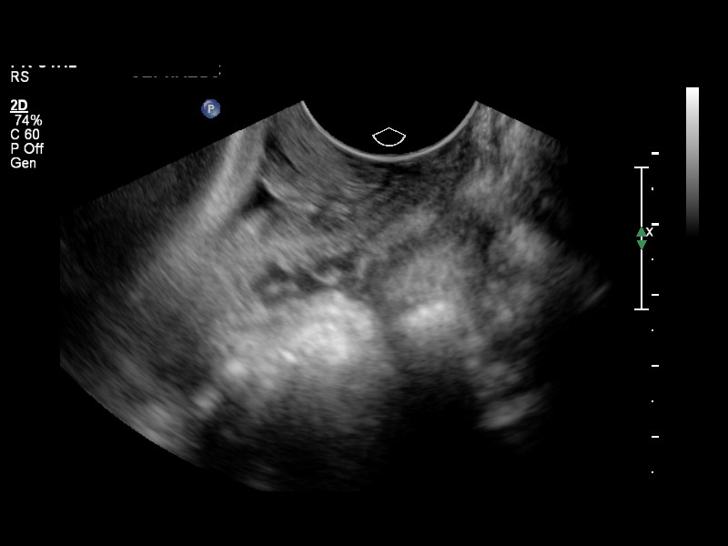
[im 10/11]
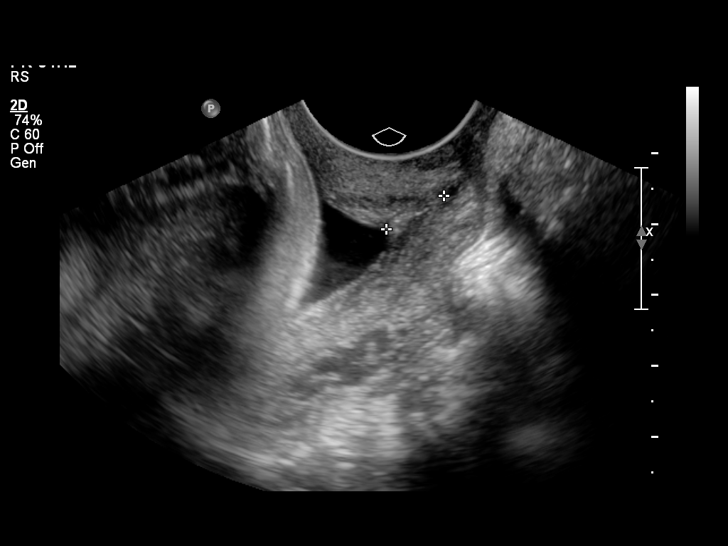
[im 11/11]
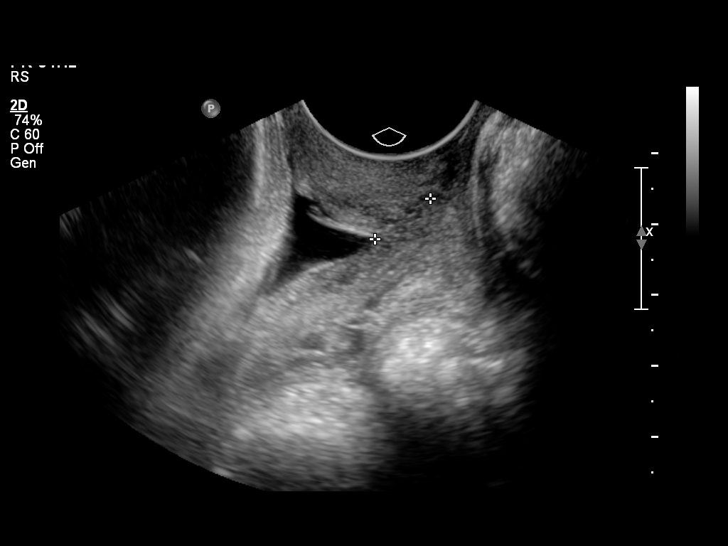

[11 of 11 positions shown; findings below may reference images not displayed]

OBSTETRICS REPORT
                      (Signed Final 06/17/2013 [DATE])

                                                         MAU/Triage
Service(s) Provided

 US OB TRANSVAGINAL                                    76817.0
Indications

 Preterm labor (> 22 weeks)
Fetal Evaluation

 Num Of Fetuses:    1
 Fetal Heart Rate:  143                         bpm
 Cardiac Activity:  Observed
 Presentation:      Cephalic
Gestational Age

 LMP:           31w 4d       Date:   11/08/12                 EDD:   08/15/13
 Best:          31w 4d    Det. By:   LMP  (11/08/12)          EDD:   08/15/13
Cervix Uterus Adnexa

 Cervical Length:   0.94      cm       Funnel Width:   1.4       cm
 Funnel Length:     1         cm

 Cervix:       Funneling of internal os noted.  Dynamic cervix.
Impression

 Dynamic cervical shortening with funneling as noted above.
 Resting length of 1.47 cm with shortest length .94 cm.

 Cephalic presentation.

 questions or concerns.

## 2014-10-21 ENCOUNTER — Inpatient Hospital Stay (HOSPITAL_COMMUNITY)
Admission: AD | Admit: 2014-10-21 | Discharge: 2014-10-21 | Disposition: A | Discharge status: Home / Self Care | Payer: Medicaid Other | Source: Ambulatory Visit | Attending: Obstetrics & Gynecology | Admitting: Obstetrics & Gynecology

## 2014-10-21 ENCOUNTER — Encounter (HOSPITAL_COMMUNITY): Payer: Self-pay | Admitting: *Deleted

## 2014-10-21 ENCOUNTER — Inpatient Hospital Stay (HOSPITAL_COMMUNITY): Payer: Medicaid Other

## 2014-10-21 DIAGNOSIS — Z3A01 Less than 8 weeks gestation of pregnancy: Secondary | ICD-10-CM | POA: Diagnosis not present

## 2014-10-21 DIAGNOSIS — O9989 Other specified diseases and conditions complicating pregnancy, childbirth and the puerperium: Secondary | ICD-10-CM

## 2014-10-21 DIAGNOSIS — O26891 Other specified pregnancy related conditions, first trimester: Secondary | ICD-10-CM | POA: Insufficient documentation

## 2014-10-21 DIAGNOSIS — R109 Unspecified abdominal pain: Secondary | ICD-10-CM | POA: Diagnosis not present

## 2014-10-21 DIAGNOSIS — Z3481 Encounter for supervision of other normal pregnancy, first trimester: Secondary | ICD-10-CM | POA: Insufficient documentation

## 2014-10-21 DIAGNOSIS — Z3491 Encounter for supervision of normal pregnancy, unspecified, first trimester: Secondary | ICD-10-CM

## 2014-10-21 DIAGNOSIS — O26899 Other specified pregnancy related conditions, unspecified trimester: Secondary | ICD-10-CM

## 2014-10-21 HISTORY — DX: Headache: R51

## 2014-10-21 HISTORY — DX: Headache, unspecified: R51.9

## 2014-10-21 LAB — URINALYSIS, ROUTINE W REFLEX MICROSCOPIC
Bilirubin Urine: NEGATIVE
Glucose, UA: NEGATIVE mg/dL
Hgb urine dipstick: NEGATIVE
Ketones, ur: NEGATIVE mg/dL
Nitrite: NEGATIVE
PH: 8 (ref 5.0–8.0)
Protein, ur: NEGATIVE mg/dL
SPECIFIC GRAVITY, URINE: 1.02 (ref 1.005–1.030)
UROBILINOGEN UA: 0.2 mg/dL (ref 0.0–1.0)

## 2014-10-21 LAB — URINE MICROSCOPIC-ADD ON

## 2014-10-21 LAB — WET PREP, GENITAL
Clue Cells Wet Prep HPF POC: NONE SEEN
Trich, Wet Prep: NONE SEEN
Yeast Wet Prep HPF POC: NONE SEEN

## 2014-10-21 LAB — CBC
HCT: 32.8 % — ABNORMAL LOW (ref 36.0–46.0)
Hemoglobin: 11.1 g/dL — ABNORMAL LOW (ref 12.0–15.0)
MCH: 30.7 pg (ref 26.0–34.0)
MCHC: 33.8 g/dL (ref 30.0–36.0)
MCV: 90.6 fL (ref 78.0–100.0)
PLATELETS: 299 10*3/uL (ref 150–400)
RBC: 3.62 MIL/uL — ABNORMAL LOW (ref 3.87–5.11)
RDW: 13.4 % (ref 11.5–15.5)
WBC: 6.9 10*3/uL (ref 4.0–10.5)

## 2014-10-21 LAB — POCT PREGNANCY, URINE: PREG TEST UR: POSITIVE — AB

## 2014-10-21 LAB — HIV ANTIBODY (ROUTINE TESTING W REFLEX): HIV 1&2 Ab, 4th Generation: NONREACTIVE

## 2014-10-21 LAB — HCG, QUANTITATIVE, PREGNANCY: hCG, Beta Chain, Quant, S: 8494 m[IU]/mL — ABNORMAL HIGH (ref <5)

## 2014-10-21 MED ORDER — PRENATAL VITAMINS 28-0.8 MG PO TABS: 1.0000 | ORAL_TABLET | Freq: Every day | ORAL | Status: AC

## 2014-10-21 NOTE — Discharge Instructions (Signed)
First Trimester of Pregnancy The first trimester of pregnancy is from week 1 until the end of week 12 (months 1 through 3). A week after a sperm fertilizes an egg, the egg will implant on the wall of the uterus. This embryo will begin to develop into a baby. Genes from you and your partner are forming the baby. The female genes determine whether the baby is a boy or a girl. At 6-8 weeks, the eyes and face are formed, and the heartbeat can be seen on ultrasound. At the end of 12 weeks, all the baby's organs are formed.  Now that you are pregnant, you will want to do everything you can to have a healthy baby. Two of the most important things are to get good prenatal care and to follow your health care provider's instructions. Prenatal care is all the medical care you receive before the baby's birth. This care will help prevent, find, and treat any problems during the pregnancy and childbirth. BODY CHANGES Your body goes through many changes during pregnancy. The changes vary from woman to woman.   You may gain or lose a couple of pounds at first.  You may feel sick to your stomach (nauseous) and throw up (vomit). If the vomiting is uncontrollable, call your health care provider.  You may tire easily.  You may develop headaches that can be relieved by medicines approved by your health care provider.  You may urinate more often. Painful urination may mean you have a bladder infection.  You may develop heartburn as a result of your pregnancy.  You may develop constipation because certain hormones are causing the muscles that push waste through your intestines to slow down.  You may develop hemorrhoids or swollen, bulging veins (varicose veins).  Your breasts may begin to grow larger and become tender. Your nipples may stick out more, and the tissue that surrounds them (areola) may become darker.  Your gums may bleed and may be sensitive to brushing and flossing.  Dark spots or blotches (chloasma,  mask of pregnancy) may develop on your face. This will likely fade after the baby is born.  Your menstrual periods will stop.  You may have a loss of appetite.  You may develop cravings for certain kinds of food.  You may have changes in your emotions from day to day, such as being excited to be pregnant or being concerned that something may go wrong with the pregnancy and baby.  You may have more vivid and strange dreams.  You may have changes in your hair. These can include thickening of your hair, rapid growth, and changes in texture. Some women also have hair loss during or after pregnancy, or hair that feels dry or thin. Your hair will most likely return to normal after your baby is born. WHAT TO EXPECT AT YOUR PRENATAL VISITS During a routine prenatal visit:  You will be weighed to make sure you and the baby are growing normally.  Your blood pressure will be taken.  Your abdomen will be measured to track your baby's growth.  The fetal heartbeat will be listened to starting around week 10 or 12 of your pregnancy.  Test results from any previous visits will be discussed. Your health care provider may ask you:  How you are feeling.  If you are feeling the baby move.  If you have had any abnormal symptoms, such as leaking fluid, bleeding, severe headaches, or abdominal cramping.  If you have any questions. Other tests   that may be performed during your first trimester include:  Blood tests to find your blood type and to check for the presence of any previous infections. They will also be used to check for low iron levels (anemia) and Rh antibodies. Later in the pregnancy, blood tests for diabetes will be done along with other tests if problems develop.  Urine tests to check for infections, diabetes, or protein in the urine.  An ultrasound to confirm the proper growth and development of the baby.  An amniocentesis to check for possible genetic problems.  Fetal screens for  spina bifida and Down syndrome.  You may need other tests to make sure you and the baby are doing well. HOME CARE INSTRUCTIONS  Medicines  Follow your health care provider's instructions regarding medicine use. Specific medicines may be either safe or unsafe to take during pregnancy.  Take your prenatal vitamins as directed.  If you develop constipation, try taking a stool softener if your health care provider approves. Diet  Eat regular, well-balanced meals. Choose a variety of foods, such as meat or vegetable-based protein, fish, milk and low-fat dairy products, vegetables, fruits, and whole grain breads and cereals. Your health care provider will help you determine the amount of weight gain that is right for you.  Avoid raw meat and uncooked cheese. These carry germs that can cause birth defects in the baby.  Eating four or five small meals rather than three large meals a day may help relieve nausea and vomiting. If you start to feel nauseous, eating a few soda crackers can be helpful. Drinking liquids between meals instead of during meals also seems to help nausea and vomiting.  If you develop constipation, eat more high-fiber foods, such as fresh vegetables or fruit and whole grains. Drink enough fluids to keep your urine clear or pale yellow. Activity and Exercise  Exercise only as directed by your health care provider. Exercising will help you:  Control your weight.  Stay in shape.  Be prepared for labor and delivery.  Experiencing pain or cramping in the lower abdomen or low back is a good sign that you should stop exercising. Check with your health care provider before continuing normal exercises.  Try to avoid standing for long periods of time. Move your legs often if you must stand in one place for a long time.  Avoid heavy lifting.  Wear low-heeled shoes, and practice good posture.  You may continue to have sex unless your health care provider directs you  otherwise. Relief of Pain or Discomfort  Wear a good support bra for breast tenderness.   Take warm sitz baths to soothe any pain or discomfort caused by hemorrhoids. Use hemorrhoid cream if your health care provider approves.   Rest with your legs elevated if you have leg cramps or low back pain.  If you develop varicose veins in your legs, wear support hose. Elevate your feet for 15 minutes, 3-4 times a day. Limit salt in your diet. Prenatal Care  Schedule your prenatal visits by the twelfth week of pregnancy. They are usually scheduled monthly at first, then more often in the last 2 months before delivery.  Write down your questions. Take them to your prenatal visits.  Keep all your prenatal visits as directed by your health care provider. Safety  Wear your seat belt at all times when driving.  Make a list of emergency phone numbers, including numbers for family, friends, the hospital, and police and fire departments. General Tips    Ask your health care provider for a referral to a local prenatal education class. Begin classes no later than at the beginning of month 6 of your pregnancy.  Ask for help if you have counseling or nutritional needs during pregnancy. Your health care provider can offer advice or refer you to specialists for help with various needs.  Do not use hot tubs, steam rooms, or saunas.  Do not douche or use tampons or scented sanitary pads.  Do not cross your legs for long periods of time.  Avoid cat litter boxes and soil used by cats. These carry germs that can cause birth defects in the baby and possibly loss of the fetus by miscarriage or stillbirth.  Avoid all smoking, herbs, alcohol, and medicines not prescribed by your health care provider. Chemicals in these affect the formation and growth of the baby.  Schedule a dentist appointment. At home, brush your teeth with a soft toothbrush and be gentle when you floss. SEEK MEDICAL CARE IF:   You have  dizziness.  You have mild pelvic cramps, pelvic pressure, or nagging pain in the abdominal area.  You have persistent nausea, vomiting, or diarrhea.  You have a bad smelling vaginal discharge.  You have pain with urination.  You notice increased swelling in your face, hands, legs, or ankles. SEEK IMMEDIATE MEDICAL CARE IF:   You have a fever.  You are leaking fluid from your vagina.  You have spotting or bleeding from your vagina.  You have severe abdominal cramping or pain.  You have rapid weight gain or loss.  You vomit blood or material that looks like coffee grounds.  You are exposed to German measles and have never had them.  You are exposed to fifth disease or chickenpox.  You develop a severe headache.  You have shortness of breath.  You have any kind of trauma, such as from a fall or a car accident. Document Released: 10/15/2001 Document Revised: 03/07/2014 Document Reviewed: 08/31/2013 Exit[employer] Patient Information 2015 [employer]. This information is not intended to replace advice given to you by your health care provider. Make sure you discuss any questions you have with your health care provider.  

## 2014-10-21 NOTE — MAU Note (Signed)
Pt C/O very sharp lower abd pain on both sides for the last 2 weeks.  Denies bleeding, has clear discharge.  Pos UPT @ MD office yesterday.

## 2014-10-21 NOTE — MAU Provider Note (Signed)
Chief Complaint: Abdominal Pain   First Provider Initiated Contact with Patient 10/21/14 1446     SUBJECTIVE HPI: Lauren Booker is a 23 y.o. G3P2002 at 4327w4d by LMP who presents to maternity admissions reporting sharp abdominal pain in her lower abdomen x 2 weeks, worsening this week. She reports positive pregnancy test at home and at her PCP's office yesterday.  She describes the pain as sharp, intermittent, occuring bilaterally in lower abdomen but worse on the left lower side.  She denies vaginal bleeding, vaginal itching/burning, urinary symptoms, h/a, dizziness, n/v, or fever/chills.     Past Medical History  Diagnosis Date  . Asthma   . Herpes simplex     last outbreak 2011  . Headache    Past Surgical History  Procedure Laterality Date  . No past surgeries     History   Social History  . Marital Status: Single    Spouse Name: N/A    Number of Children: N/A  . Years of Education: N/A   Occupational History  . Not on file.   Social History Main Topics  . Smoking status: Never Smoker   . Smokeless tobacco: Never Used  . Alcohol Use: No  . Drug Use: No  . Sexual Activity: Yes    Birth Control/ Protection: None   Other Topics Concern  . Not on file   Social History Narrative   No current facility-administered medications on file prior to encounter.   Current Outpatient Prescriptions on File Prior to Encounter  Medication Sig Dispense Refill  . albuterol (PROVENTIL HFA;VENTOLIN HFA) 108 (90 BASE) MCG/ACT inhaler Inhale 2 puffs into the lungs every 6 (six) hours as needed for wheezing.    Marland Kitchen. ibuprofen (ADVIL,MOTRIN) 600 MG tablet Take 1 tablet (600 mg total) by mouth every 6 (six) hours. (Patient not taking: Reported on 10/21/2014) 30 tablet 0  . oxyCODONE-acetaminophen (PERCOCET/ROXICET) 5-325 MG per tablet Take 1-2 tablets by mouth every 6 (six) hours as needed. (Patient not taking: Reported on 10/21/2014) 30 tablet 0  . Prenatal Vit-Fe Fumarate-FA (PRENATAL  MULTIVITAMIN) TABS Take 1 tablet by mouth daily. (Patient not taking: Reported on 10/21/2014) 90 tablet 3  . valACYclovir (VALTREX) 1000 MG tablet Take 1 tablet (1,000 mg total) by mouth daily. (Patient not taking: Reported on 10/21/2014) 30 tablet 12   No Known Allergies  ROS: Pertinent items in HPI  OBJECTIVE Blood pressure 105/67, pulse 82, temperature 98.5 F (36.9 C), temperature source Oral, resp. rate 18, height 5\' 3"  (1.6 m), weight 57.153 kg (126 lb), last menstrual period 09/19/2014, unknown if currently breastfeeding. GENERAL: Well-developed, well-nourished female in no acute distress.  HEENT: Normocephalic HEART: normal rate RESP: normal effort ABDOMEN: Soft, non-tender EXTREMITIES: Nontender, no edema NEURO: Alert and oriented Pelvic exam: Cervix pink, visually closed, without lesion, moderate amount light brown/tan thick discharge, vaginal walls and external genitalia normal Bimanual exam: Cervix 0/long/high, firm, anterior, neg CMT, uterus nontender, nonenlarged, adnexa without tenderness, enlargement, or mass  LAB RESULTS Results for orders placed or performed during the hospital encounter of 10/21/14 (from the past 24 hour(s))  Urinalysis, Routine w reflex microscopic     Status: Abnormal   Collection Time: 10/21/14  1:15 PM  Result Value Ref Range   Color, Urine YELLOW YELLOW   APPearance CLEAR CLEAR   Specific Gravity, Urine 1.020 1.005 - 1.030   pH 8.0 5.0 - 8.0   Glucose, UA NEGATIVE NEGATIVE mg/dL   Hgb urine dipstick NEGATIVE NEGATIVE   Bilirubin Urine NEGATIVE  NEGATIVE   Ketones, ur NEGATIVE NEGATIVE mg/dL   Protein, ur NEGATIVE NEGATIVE mg/dL   Urobilinogen, UA 0.2 0.0 - 1.0 mg/dL   Nitrite NEGATIVE NEGATIVE   Leukocytes, UA TRACE (A) NEGATIVE  Urine microscopic-add on     Status: Abnormal   Collection Time: 10/21/14  1:15 PM  Result Value Ref Range   Squamous Epithelial / LPF FEW (A) RARE   WBC, UA 0-2 <3 WBC/hpf   Bacteria, UA MANY (A) RARE    Urine-Other AMORPHOUS URATES/PHOSPHATES   Pregnancy, urine POC     Status: Abnormal   Collection Time: 10/21/14  1:20 PM  Result Value Ref Range   Preg Test, Ur POSITIVE (A) NEGATIVE  CBC     Status: Abnormal   Collection Time: 10/21/14  2:21 PM  Result Value Ref Range   WBC 6.9 4.0 - 10.5 K/uL   RBC 3.62 (L) 3.87 - 5.11 MIL/uL   Hemoglobin 11.1 (L) 12.0 - 15.0 g/dL   HCT 29.5 (L) 62.1 - 30.8 %   MCV 90.6 78.0 - 100.0 fL   MCH 30.7 26.0 - 34.0 pg   MCHC 33.8 30.0 - 36.0 g/dL   RDW 65.7 84.6 - 96.2 %   Platelets 299 150 - 400 K/uL  hCG, quantitative, pregnancy     Status: Abnormal   Collection Time: 10/21/14  2:21 PM  Result Value Ref Range   hCG, Beta Chain, Quant, S 8494 (H) <5 mIU/mL  Wet prep, genital     Status: Abnormal   Collection Time: 10/21/14  2:45 PM  Result Value Ref Range   Yeast Wet Prep HPF POC NONE SEEN NONE SEEN   Trich, Wet Prep NONE SEEN NONE SEEN   Clue Cells Wet Prep HPF POC NONE SEEN NONE SEEN   WBC, Wet Prep HPF POC MANY (A) NONE SEEN    IMAGING US Ob Comp Less 14 Wks  10/21/2014   CLINICAL DATA:  Patient with lower abdominal pain for 2 weeks. Positive pregnancy test.  EXAM: OBSTETRIC <14 WK [place] AND TRANSVAGINAL OB US  TECHNIQUE: Both transabdominal and transvaginal ultrasound examinations were performed for complete evaluation of the gestation as well as the maternal uterus, adnexal regions, and pelvic cul-de-sac. Transvaginal technique was performed to assess early pregnancy.  COMPARISON:  Pelvic ultrasound 06/17/2013  FINDINGS: Intrauterine gestational sac: Visualized/normal in shape.  Yolk sac:  Present  Embryo:  Not present  Cardiac Activity: Not present  MSD:  13  mm   6 w   1  d  Maternal uterus/adnexae: Normal right and left ovaries. Probable corpus luteum within the right ovary. Small amount of free fluid in the pelvis. No subchorionic hemorrhage.  IMPRESSION: Probable early intrauterine gestational gestational sac and yolk sac without fetal pole or  cardiac activity yet visualized. Recommend follow-up quantitative B-HCG levels and follow-up US in 14 days to confirm and assess viability. This recommendation follows SRU consensus guidelines: Diagnostic Criteria for Nonviable Pregnancy Early in the First Trimester. Malva Limes Med 2013; 952:8413-24.   Electronically Signed   By: Annia Belt M.D.   On: 10/21/2014 16:41   US Ob Transvaginal  10/21/2014   CLINICAL DATA:  Patient with lower abdominal pain for 2 weeks. Positive pregnancy test.  EXAM: OBSTETRIC <14 WK [place] AND TRANSVAGINAL OB US  TECHNIQUE: Both transabdominal and transvaginal ultrasound examinations were performed for complete evaluation of the gestation as well as the maternal uterus, adnexal regions, and pelvic cul-de-sac. Transvaginal technique was performed to assess early  pregnancy.  COMPARISON:  Pelvic ultrasound 06/17/2013  FINDINGS: Intrauterine gestational sac: Visualized/normal in shape.  Yolk sac:  Present  Embryo:  Not present  Cardiac Activity: Not present  MSD:  13  mm   6 w   1  d  Maternal uterus/adnexae: Normal right and left ovaries. Probable corpus luteum within the right ovary. Small amount of free fluid in the pelvis. No subchorionic hemorrhage.  IMPRESSION: Probable early intrauterine gestational gestational sac and yolk sac without fetal pole or cardiac activity yet visualized. Recommend follow-up quantitative B-HCG levels and follow-up US in 14 days to confirm and assess viability. This recommendation follows SRU consensus guidelines: Diagnostic Criteria for Nonviable Pregnancy Early in the First Trimester. Malva Limes Engl J Med 2013; 865:7846-96; 369:1443-51.   Electronically Signed   By: Annia Beltrew  Davis M.D.   On: 10/21/2014 16:41    ASSESSMENT 1. Normal IUP (intrauterine pregnancy) on prenatal ultrasound, first trimester   2. Abdominal pain affecting pregnancy     PL[facility] F/U with early prenatal care.  Pt plans to move out of [place] next [place].  Will contact OB/Gyn  providers to make appointment PNV Rx sent to pharmacy Return to MAU as needed for emergencies   Follow-up Information    Please follow up.   Why:  With prenatal provider of your choice      [facility].   Why:  As needed for emergencies   Contact information:   [address] [id number] [place]c [place] [id number] [phone]      Sharen CounterLisa Leftwich-Kirby Certified Nurse-Midwife 10/21/2014  5:10 PM

## 2014-10-22 LAB — GC/CHLAMYDIA PROBE AMP
CT Probe RNA: NEGATIVE
GC PROBE AMP APTIMA: NEGATIVE

## 2015-08-26 ENCOUNTER — Encounter (HOSPITAL_COMMUNITY): Payer: Self-pay | Admitting: *Deleted

## 2022-04-23 ENCOUNTER — Encounter (HOSPITAL_COMMUNITY): Payer: Self-pay

## 2022-04-23 ENCOUNTER — Emergency Department (HOSPITAL_COMMUNITY)
Admission: EM | Admit: 2022-04-23 | Discharge: 2022-04-23 | Disposition: A | Discharge status: Home / Self Care | Payer: Medicaid - Out of State | Attending: Emergency Medicine | Admitting: Emergency Medicine

## 2022-04-23 DIAGNOSIS — N760 Acute vaginitis: Secondary | ICD-10-CM | POA: Insufficient documentation

## 2022-04-23 DIAGNOSIS — B9689 Other specified bacterial agents as the cause of diseases classified elsewhere: Secondary | ICD-10-CM | POA: Insufficient documentation

## 2022-04-23 DIAGNOSIS — N898 Other specified noninflammatory disorders of vagina: Secondary | ICD-10-CM | POA: Diagnosis present

## 2022-04-23 LAB — URINALYSIS, ROUTINE W REFLEX MICROSCOPIC
Bilirubin Urine: NEGATIVE
Glucose, UA: NEGATIVE mg/dL
Ketones, ur: 5 mg/dL — AB
Leukocytes,Ua: NEGATIVE
Nitrite: NEGATIVE
Protein, ur: NEGATIVE mg/dL
Specific Gravity, Urine: 1.019 (ref 1.005–1.030)
pH: 5 (ref 5.0–8.0)

## 2022-04-23 LAB — PREGNANCY, URINE: Preg Test, Ur: NEGATIVE

## 2022-04-23 LAB — WET PREP, GENITAL
Sperm: NONE SEEN
Trich, Wet Prep: NONE SEEN
WBC, Wet Prep HPF POC: >=10 — AB (ref <10)
Yeast Wet Prep HPF POC: NONE SEEN

## 2022-04-23 MED ORDER — METRONIDAZOLE 500 MG PO TABS: 500.0000 mg | ORAL_TABLET | Freq: Two times a day (BID) | ORAL | 0 refills | Status: AC

## 2022-04-23 NOTE — ED Triage Notes (Signed)
Pt arrived via POV, c/o vaginal discharge, white at first and then pink for several days. Denies any itching.

## 2022-04-23 NOTE — ED Provider Notes (Addendum)
[facility]-EMERGENCY DEPT Provider Note   CSN: [id number] Arrival date & time: 04/23/22  2694     History  Chief Complaint  Patient presents with   Vaginal Discharge    Lauren Booker is a 31 y.o. female.  Patient c/o vaginal discharge in the past few days. Symptoms acute onset, mild-mod, persistent. States has varied from whitish to mildly pink. Last period 2 weeks ago. Denies abd or pelvic pain. No recent abx use. No known std exposure. No dysuria. No fever or chills. Has been on ocp for past 5-6 months.   The history is provided by the patient and medical records.  Vaginal Discharge Associated symptoms: no abdominal pain, no dysuria and no fever        Home Medications Prior to Admission medications   Medication Sig Start Date End Date Taking? Authorizing Provider  albuterol (PROVENTIL HFA;VENTOLIN HFA) 108 (90 BASE) MCG/ACT inhaler Inhale 2 puffs into the lungs every 6 (six) hours as needed for wheezing.    [provider]  Prenatal Vit-Fe Fumarate-FA (PRENATAL MULTIVITAMIN) TABS Take 1 tablet by mouth daily. Patient not taking: Reported on 10/21/2014 02/24/13   Glori Luis, MD  Prenatal Vit-Fe Fumarate-FA (PRENATAL VITAMINS) 28-0.8 MG TABS Take 1 tablet by mouth daily. 10/21/14   Leftwich-Kirby, Wilmer Floor, CNM      Allergies    Patient has no known allergies.    Review of Systems   Review of Systems  Constitutional:  Negative for chills and fever.  Gastrointestinal:  Negative for abdominal pain.  Genitourinary:  Positive for vaginal discharge. Negative for dysuria and pelvic pain.    Physical Exam Updated Vital Signs BP (!) 123/95 (BP Location: Left Arm)   Pulse 71   Temp 98.1 F (36.7 C) (Oral)   Resp 18   SpO2 99%  Physical Exam Vitals and nursing note reviewed.  Constitutional:      Appearance: Normal appearance. She is well-developed.  HENT:     Head: Atraumatic.     Nose: Nose normal.     Mouth/Throat:      Mouth: Mucous membranes are moist.  Eyes:     General: No scleral icterus.    Conjunctiva/sclera: Conjunctivae normal.  Neck:     Trachea: No tracheal deviation.  Cardiovascular:     Rate and Rhythm: Normal rate.     Pulses: Normal pulses.  Pulmonary:     Effort: Pulmonary effort is normal. No respiratory distress.  Abdominal:     General: Bowel sounds are normal. There is no distension.     Palpations: Abdomen is soft. There is no mass.     Tenderness: There is no abdominal tenderness. There is no guarding.  Genitourinary:    Comments: No cva tenderness. Mild pinkish discharge. No cmt. No adnexal masses or  tenderness.  Musculoskeletal:        General: No swelling.     Cervical back: Neck supple. No muscular tenderness.  Skin:    General: Skin is warm and dry.     Findings: No rash.  Neurological:     Mental Status: She is alert.     Comments: Alert, speech normal.   Psychiatric:        Mood and Affect: Mood normal.     ED Results / Procedures / Treatments   Labs (all labs ordered are listed, but only abnormal results are displayed) Labs Reviewed  WET PREP, GENITAL - Abnormal; Notable for the following components:  Result Value   Clue Cells Wet Prep HPF POC PRESENT (*)    WBC, Wet Prep HPF POC >=10 (*)    All other components within normal limits  PREGNANCY, URINE  URINALYSIS, ROUTINE W REFLEX MICROSCOPIC  GC/CHLAMYDIA PROBE AMP ([place]) NOT AT [facility]    EKG None  Radiology No results found.  Procedures Procedures    Medications Ordered in ED Medications - No data to display  ED Course/ Medical Decision Making/ A&P                           Medical Decision Making Problems Addressed: BV (bacterial vaginosis): acute illness or injury with systemic symptoms Vaginal discharge: acute illness or injury with systemic symptoms  Amount and/or Complexity of Data Reviewed External Data Reviewed: notes. Labs: ordered. Decision-making details documented  in ED Course.  Risk Prescription drug management.   Labs ordered.  Reviewed nursing notes and prior charts for additional history.   Labs reviewed/interpreted by me - wet prep w clue cells.   Abd soft nt, no cmt, no abd or pelvic pain.   Pt currently appears stable for d/c.  Rec pcp/gyn f/u as outpatient.  Rx for home.           Final Clinical Impression(s) / ED Diagnoses Final diagnoses:  None    Rx / DC Orders ED Discharge Orders     None           Cathren Laine, MD 04/23/22 1327

## 2022-04-23 NOTE — ED Provider Triage Note (Signed)
Emergency Medicine Provider Triage Evaluation Note  Lauren Booker , a 31 y.o. female  was evaluated in triage.  Pt complains of vaginal discharge for several days.  States that ever since she started new birth control by 5 months ago her periods have been somewhat irregular.  She initially noticed some white discharge, that became pink.  She is having some lower abdominal cramping as well.  Review of Systems  Positive: Vaginal discharge, abdominal cramping Negative: Fever, vomiting, urinary symptoms, vaginal itching or burning  Physical Exam  BP (!) 123/95 (BP Location: Left Arm)   Pulse 71   Temp 98.1 F (36.7 C) (Oral)   Resp 18   SpO2 99%  Gen:   Awake, no distress   Resp:  Normal effort  MSK:   Moves extremities without difficulty  Other:    Medical Decision Making  Medically screening exam initiated at 10:41 AM.  Appropriate orders placed.  Magalie Almon Lavergne was informed that the remainder of the evaluation will be completed by another provider, this initial triage assessment does not replace that evaluation, and the importance of remaining in the ED until their evaluation is complete.     Darice Vicario T, PA-C 04/23/22 1042

## 2022-04-23 NOTE — Discharge Instructions (Addendum)
It was our pleasure to provide your ER care today - we hope that you feel better.  Take flagyl as prescribed.   Follow up with primary care doctor/gyn doctor in 1-2 weeks if symptoms fail to improve/resolve.  Return to ER if worse, new symptoms, fevers, new/severe pain, or other concern.

## 2022-04-24 LAB — GC/CHLAMYDIA PROBE AMP (~~LOC~~) NOT AT ARMC
Chlamydia: NEGATIVE
Comment: NEGATIVE
Comment: NORMAL
Neisseria Gonorrhea: NEGATIVE

## 2024-06-25 ENCOUNTER — Other Ambulatory Visit: Payer: Self-pay

## 2024-06-25 ENCOUNTER — Emergency Department (HOSPITAL_BASED_OUTPATIENT_CLINIC_OR_DEPARTMENT_OTHER)
Admission: EM | Admit: 2024-06-25 | Discharge: 2024-06-26 | Disposition: A | Discharge status: Home / Self Care | Payer: Self-pay | Attending: Emergency Medicine | Admitting: Emergency Medicine

## 2024-06-25 ENCOUNTER — Encounter (HOSPITAL_BASED_OUTPATIENT_CLINIC_OR_DEPARTMENT_OTHER): Payer: Self-pay | Admitting: Emergency Medicine

## 2024-06-25 DIAGNOSIS — N939 Abnormal uterine and vaginal bleeding, unspecified: Secondary | ICD-10-CM | POA: Insufficient documentation

## 2024-06-25 LAB — PREGNANCY, URINE: Preg Test, Ur: NEGATIVE

## 2024-06-25 NOTE — ED Triage Notes (Signed)
 Positive preg at home last week. Mild cramping and bleeding 2 days. Denies large clots and dizziness.

## 2024-06-26 LAB — COMPREHENSIVE METABOLIC PANEL WITH GFR
ALT: 8 U/L (ref 0–44)
AST: 17 U/L (ref 15–41)
Albumin: 4.3 g/dL (ref 3.5–5.0)
Alkaline Phosphatase: 67 U/L (ref 38–126)
Anion gap: 13 (ref 5–15)
BUN: 8 mg/dL (ref 6–20)
CO2: 22 mmol/L (ref 22–32)
Calcium: 9.2 mg/dL (ref 8.9–10.3)
Chloride: 101 mmol/L (ref 98–111)
Creatinine, Ser: 0.66 mg/dL (ref 0.44–1.00)
GFR, Estimated: >60 mL/min (ref >60)
Glucose, Bld: 91 mg/dL (ref 70–99)
Potassium: 2.9 mmol/L — ABNORMAL LOW (ref 3.5–5.1)
Sodium: 136 mmol/L (ref 135–145)
Total Bilirubin: 0.7 mg/dL (ref 0.0–1.2)
Total Protein: 7.3 g/dL (ref 6.5–8.1)

## 2024-06-26 LAB — CBC
HCT: 30.5 % — ABNORMAL LOW (ref 36.0–46.0)
Hemoglobin: 9.7 g/dL — ABNORMAL LOW (ref 12.0–15.0)
MCH: 26.9 pg (ref 26.0–34.0)
MCHC: 31.8 g/dL (ref 30.0–36.0)
MCV: 84.7 fL (ref 80.0–100.0)
Platelets: 364 K/uL (ref 150–400)
RBC: 3.6 MIL/uL — ABNORMAL LOW (ref 3.87–5.11)
RDW: 16.6 % — ABNORMAL HIGH (ref 11.5–15.5)
WBC: 6 K/uL (ref 4.0–10.5)
nRBC: 0 % (ref 0.0–0.2)

## 2024-06-26 LAB — HCG, QUANTITATIVE, PREGNANCY: hCG, Beta Chain, Quant, S: 2 m[IU]/mL (ref <5)

## 2024-06-26 NOTE — ED Provider Notes (Signed)
 [facility]ERGENCY [facility] Emergency Department Provider Note MRN:  [id number]  Arrival date & time: 06/26/24     Chief Complaint   Vaginal Bleeding   History of Present Illness   Lauren Booker is a 33 y.o. year-old female with no pertinent past medical history presenting to the ED with chief complaint of vaginal bleeding.  Vaginal bleeding starting today.  Had several positive pregnancy test last week.  Would be 4 weeks and 2 days pregnant today based on first day of last menstrual cycle.  Mild lower abdominal cramping today as well, no fever.  Review of Systems  A thorough review of systems was obtained and all systems are negative except as noted in the HPI and PMH.   Patient's Health History    Past Medical History:  Diagnosis Date   Asthma    Headache    Herpes simplex    last outbreak 2011    Past Surgical History:  Procedure Laterality Date   NO PAST SURGERIES      Family History  Problem Relation Age of Onset   Hypertension Maternal Grandmother    Diabetes Maternal Grandmother    Hypertension Paternal Grandmother    Diabetes Paternal Grandmother    Anesthesia problems Neg Hx     Social History   Socioeconomic History   Marital status: Single    Spouse name: Not on file   Number of children: Not on file   Years of education: Not on file   Highest education level: Not on file  Occupational History   Not on file  Tobacco Use   Smoking status: Never   Smokeless tobacco: Never  Substance and Sexual Activity   Alcohol use: No   Drug use: No   Sexual activity: Yes    Birth control/protection: None  Other Topics Concern   Not on file  Social History Narrative   Not on file   Social Drivers of Health   Financial Resource Strain: Not on file  Food Insecurity: Not on file  Transportation Needs: Not on file  Physical Activity: Not on file  Stress: Not on file  Social Connections: Not on file  Intimate Partner Violence: Unknown (01/25/2023)    Received from [facility]   Abuse Indicators    Interpersonal Safety: Not on file     Physical Exam   Vitals:   06/25/24 2332 06/25/24 2345  BP:  121/88  Pulse:  72  Resp:  18  Temp: 98.2 F (36.8 C)   SpO2:  100%    CONSTITUTIONAL: Well-appearing, NAD NEURO/PSYCH:  Alert and oriented x 3, no focal deficits EYES:  eyes equal and reactive ENT/NECK:  no LAD, no JVD CARDIO: Regular rate, well-perfused, normal S1 and S2 PULM:  CTAB no wheezing or rhonchi GI/GU:  non-distended, non-tender MSK/SPINE:  No gross deformities, no edema SKIN:  no rash, atraumatic   *Additional and/or pertinent findings included in MDM below  Diagnostic and Interventional Summary    EKG Interpretation Date/Time:    Ventricular Rate:    PR Interval:    QRS Duration:    QT Interval:    QTC Calculation:   R Axis:      Text Interpretation:         Labs Reviewed  CBC - Abnormal; Notable for the following components:      Result Value   RBC 3.60 (*)    Hemoglobin 9.7 (*)    HCT 30.5 (*)    RDW 16.6 (*)  All other components within normal limits  COMPREHENSIVE METABOLIC PANEL WITH GFR - Abnormal; Notable for the following components:   Potassium 2.9 (*)    All other components within normal limits  PREGNANCY, URINE  HCG, QUANTITATIVE, PREGNANCY    No orders to display    Medications - No data to display   Procedures  /  Critical Care Procedures  ED Course and Medical Decision Making  Initial Impression and Ddx Well-appearing in no acute distress with normal vital signs, abdomen soft and nontender.  Differential diagnosis includes miscarriage, ectopic pregnancy, bleeding in early pregnancy.  Past medical/surgical history that increases complexity of ED encounter: None  Interpretation of Diagnostics I personally reviewed the Laboratory Testing and my interpretation is as follows: No significant blood count or electrolyte disturbance.  hCG is negative  Patient  Reassessment and Ultimate Disposition/Management     I had a discussion with the patient after the hCG pregnancy test was negative, she was not quite convinced given 5 positive pregnancy test last week.  hCG quant is also negative.  She showed me a picture of the positive pregnancy test on her phone.  And so most likely explanation is that patient has had a miscarriage, no indication for further testing or admission, appropriate for discharge.  Patient management required discussion with the following services or consulting groups:  None  Complexity of Problems Addressed Acute complicated illness or Injury  Additional Data Reviewed and Analyzed Further history obtained from: None  Additional Factors Impacting ED Encounter Risk None  Ozell HERO. Theadore, MD [facility] Emergency Medicine [facility] [email or web address]  Final Clinical Impressions(s) / ED Diagnoses     ICD-10-CM   1. Vaginal bleeding  N93.9       ED Discharge Orders     None        Discharge Instructions Discussed with and Provided to Patient:    Discharge Instructions      You were evaluated in the Emergency Department and after careful evaluation, we did not find any emergent condition requiring admission or further testing in the hospital.  Your exam/testing today is overall reassuring.  Your pregnancy test here in the emergency department was negative.  We suspect you are having a miscarriage.  Would recommend follow-up with your OB/GYN.  Please return to the Emergency Department if you experience any worsening of your condition.   Thank you for allowing us  to be a part of your care.      Theadore Ozell HERO, MD 06/26/24 [phone]

## 2024-06-26 NOTE — Discharge Instructions (Signed)
 You were evaluated in the Emergency Department and after careful evaluation, we did not find any emergent condition requiring admission or further testing in the hospital.  Your exam/testing today is overall reassuring.  Your pregnancy test here in the emergency department was negative.  We suspect you are having a miscarriage.  Would recommend follow-up with your OB/GYN.  Please return to the Emergency Department if you experience any worsening of your condition.   Thank you for allowing us  to be a part of your care.
